# Patient Record
Sex: Female | Born: 1935
Health system: Southern US, Community
[De-identification: ages and names within clinical notes are randomized; demographics above are authoritative.]

## PROBLEM LIST (undated history)

## (undated) DIAGNOSIS — E7800 Pure hypercholesterolemia, unspecified: Secondary | ICD-10-CM

## (undated) DIAGNOSIS — C801 Malignant (primary) neoplasm, unspecified: Secondary | ICD-10-CM

## (undated) DIAGNOSIS — I10 Essential (primary) hypertension: Secondary | ICD-10-CM

## (undated) HISTORY — PX: ABDOMINAL SURGERY: SHX537

## (undated) HISTORY — PX: ABDOMINAL HYSTERECTOMY: SHX81

## (undated) HISTORY — PX: BACK SURGERY: SHX140

---

## 1998-01-05 ENCOUNTER — Other Ambulatory Visit: Admission: RE | Admit: 1998-01-05 | Discharge: 1998-01-05 | Payer: Self-pay | Admitting: Internal Medicine

## 1998-05-17 ENCOUNTER — Encounter: Payer: Self-pay | Admitting: Internal Medicine

## 1998-05-17 ENCOUNTER — Ambulatory Visit (HOSPITAL_COMMUNITY): Admission: RE | Admit: 1998-05-17 | Discharge: 1998-05-17 | Payer: Self-pay | Admitting: Internal Medicine

## 1999-12-22 ENCOUNTER — Emergency Department (HOSPITAL_COMMUNITY): Admission: EM | Admit: 1999-12-22 | Discharge: 1999-12-22 | Payer: Self-pay | Admitting: Emergency Medicine

## 2003-05-26 ENCOUNTER — Emergency Department (HOSPITAL_COMMUNITY): Admission: EM | Admit: 2003-05-26 | Discharge: 2003-05-26 | Payer: Self-pay | Admitting: Emergency Medicine

## 2003-05-26 ENCOUNTER — Encounter: Payer: Self-pay | Admitting: Emergency Medicine

## 2007-04-28 ENCOUNTER — Encounter (INDEPENDENT_AMBULATORY_CARE_PROVIDER_SITE_OTHER): Payer: Self-pay | Admitting: *Deleted

## 2007-04-28 ENCOUNTER — Ambulatory Visit (HOSPITAL_COMMUNITY): Admission: RE | Admit: 2007-04-28 | Discharge: 2007-04-28 | Payer: Self-pay | Admitting: *Deleted

## 2010-12-18 NOTE — Op Note (Signed)
NAMEPRESTON, Susan Bradford                 ACCOUNT NO.:  [id number]   MEDICAL RECORD NO.:  [id number]          PATIENT TYPE:  AMB   LOCATION:  ENDO                         FACILITY:  [facility]   PHYSICIAN:  Georgiana Spinner, M.D.    DATE OF BIRTH:  24-May-1934   DATE OF PROCEDURE:  04/28/2007  DATE OF DISCHARGE:                               OPERATIVE REPORT   PROCEDURE:  Upper endoscopy.   INDICATIONS:  Gastroesophageal reflux disease.   ANESTHESIA:  Fentanyl 75 mcg, Versed 4 mg.   PROCEDURE:  With the patient mildly sedated in the left lateral  decubitus position, the Pentax videoscopic endoscope was inserted in the  mouth, passed under direct vision through the esophagus into the  stomach.  Fundus body appeared normal, antrum in the prepyloric area had  one thickened fold which I photographed and biopsied.  Duodenal bulb  showed changes of diffuse erythema which was photographed and the  erythematous areas were biopsied.  Second portion duodenum appeared  normal.  From this point the endoscope was slowly withdrawn taking  circumferential views of duodenal mucosa until the endoscope been pulled  back into the stomach placed in retroflexion to view the stomach from  below.  The endoscope was straightened and withdrawn taking  circumferential views of remaining gastric and esophageal mucosa,  stopping in the distal esophagus to photograph and biopsy an area that  was seen to rule out Barrett's esophagus.  The patient's vital signs,  pulse oximeter remained stable.  The patient tolerated procedure well  without apparent complications.   FINDINGS:  Changes as noted above.  Biopsies taken of distal esophagus,  prepyloric area and duodenal bulb, await biopsy reports.  The patient  will call me for results and follow-up with me as an outpatient.  Proceed to colonoscopy.           ______________________________  Georgiana Spinner, M.D.     GMO/MEDQ  D:  04/28/2007  T:  04/28/2007  Job:  [id number]

## 2010-12-18 NOTE — Op Note (Signed)
Susan Bradford, Susan Bradford                 ACCOUNT NO.:  [id number]   MEDICAL RECORD NO.:  [id number]          PATIENT TYPE:  AMB   LOCATION:  ENDO                         FACILITY:  [facility]   PHYSICIAN:  Georgiana Spinner, M.D.    DATE OF BIRTH:  [birth year]   DATE OF PROCEDURE:  04/28/2007  DATE OF DISCHARGE:                               OPERATIVE REPORT   PROCEDURE:  Flexible sigmoidoscopy.   INDICATIONS:  Colon cancer screening.   ANESTHESIA:  Fentanyl 25 mcg, Versed 1 mg.   PROCEDURE:  With the patient mildly sedated in the left lateral  decubitus position, the Pentax videoscopic colonoscope was inserted the  rectum, passed under direct vision to the sigmoid colon.  It could be  advanced no further without buckling and discomfort despite changing the  direction of passage.  The endoscope was then withdrawn and the  pediatric colonoscope was inserted in the rectum and passed to the same  point and it, too, could be passed no further.  Therefore I elected to  terminate the procedure at this point and withdrew the colonoscope  taking circumferential views of the colonic mucosa, stopping in the  rectum, which appeared normal on direct and showed hemorrhoids on  retroflexed view.  The endoscope was straightened and withdrawn.  The  patient's vital signs and pulse oximeter remained stable.  The patient  tolerated the procedure well without apparent complications.   FINDINGS:  Internal hemorrhoids, some diverticula seen.   PLAN:  Proceed to barium enema.           ______________________________  Georgiana Spinner, M.D.     GMO/MEDQ  D:  04/28/2007  T:  04/28/2007  Job:  [id number]

## 2014-10-25 DIAGNOSIS — D225 Melanocytic nevi of trunk: Secondary | ICD-10-CM | POA: Diagnosis not present

## 2014-10-25 DIAGNOSIS — L821 Other seborrheic keratosis: Secondary | ICD-10-CM | POA: Diagnosis not present

## 2014-10-25 DIAGNOSIS — Z85828 Personal history of other malignant neoplasm of skin: Secondary | ICD-10-CM | POA: Diagnosis not present

## 2014-10-25 DIAGNOSIS — D485 Neoplasm of uncertain behavior of skin: Secondary | ICD-10-CM | POA: Diagnosis not present

## 2014-10-25 DIAGNOSIS — L738 Other specified follicular disorders: Secondary | ICD-10-CM | POA: Diagnosis not present

## 2014-10-25 DIAGNOSIS — D2262 Melanocytic nevi of left upper limb, including shoulder: Secondary | ICD-10-CM | POA: Diagnosis not present

## 2014-10-25 DIAGNOSIS — D1801 Hemangioma of skin and subcutaneous tissue: Secondary | ICD-10-CM | POA: Diagnosis not present

## 2014-11-03 DIAGNOSIS — I10 Essential (primary) hypertension: Secondary | ICD-10-CM | POA: Diagnosis not present

## 2014-11-03 DIAGNOSIS — E782 Mixed hyperlipidemia: Secondary | ICD-10-CM | POA: Diagnosis not present

## 2014-11-03 DIAGNOSIS — R5383 Other fatigue: Secondary | ICD-10-CM | POA: Diagnosis not present

## 2014-11-03 DIAGNOSIS — Z0000 Encounter for general adult medical examination without abnormal findings: Secondary | ICD-10-CM | POA: Diagnosis not present

## 2014-11-03 DIAGNOSIS — Z1389 Encounter for screening for other disorder: Secondary | ICD-10-CM | POA: Diagnosis not present

## 2014-11-24 DIAGNOSIS — M810 Age-related osteoporosis without current pathological fracture: Secondary | ICD-10-CM | POA: Diagnosis not present

## 2014-11-24 DIAGNOSIS — E782 Mixed hyperlipidemia: Secondary | ICD-10-CM | POA: Diagnosis not present

## 2014-11-24 DIAGNOSIS — Z0000 Encounter for general adult medical examination without abnormal findings: Secondary | ICD-10-CM | POA: Diagnosis not present

## 2014-11-24 DIAGNOSIS — M150 Primary generalized (osteo)arthritis: Secondary | ICD-10-CM | POA: Diagnosis not present

## 2014-12-01 DIAGNOSIS — M5441 Lumbago with sciatica, right side: Secondary | ICD-10-CM | POA: Diagnosis not present

## 2014-12-01 DIAGNOSIS — I10 Essential (primary) hypertension: Secondary | ICD-10-CM | POA: Diagnosis not present

## 2014-12-01 DIAGNOSIS — J449 Chronic obstructive pulmonary disease, unspecified: Secondary | ICD-10-CM | POA: Diagnosis not present

## 2014-12-01 DIAGNOSIS — Z780 Asymptomatic menopausal state: Secondary | ICD-10-CM | POA: Diagnosis not present

## 2014-12-14 DIAGNOSIS — M9904 Segmental and somatic dysfunction of sacral region: Secondary | ICD-10-CM | POA: Diagnosis not present

## 2014-12-14 DIAGNOSIS — M9905 Segmental and somatic dysfunction of pelvic region: Secondary | ICD-10-CM | POA: Diagnosis not present

## 2014-12-14 DIAGNOSIS — M5136 Other intervertebral disc degeneration, lumbar region: Secondary | ICD-10-CM | POA: Diagnosis not present

## 2014-12-14 DIAGNOSIS — M9903 Segmental and somatic dysfunction of lumbar region: Secondary | ICD-10-CM | POA: Diagnosis not present

## 2014-12-15 DIAGNOSIS — M9903 Segmental and somatic dysfunction of lumbar region: Secondary | ICD-10-CM | POA: Diagnosis not present

## 2014-12-15 DIAGNOSIS — M9905 Segmental and somatic dysfunction of pelvic region: Secondary | ICD-10-CM | POA: Diagnosis not present

## 2014-12-15 DIAGNOSIS — M9904 Segmental and somatic dysfunction of sacral region: Secondary | ICD-10-CM | POA: Diagnosis not present

## 2014-12-15 DIAGNOSIS — M5136 Other intervertebral disc degeneration, lumbar region: Secondary | ICD-10-CM | POA: Diagnosis not present

## 2014-12-16 DIAGNOSIS — M5136 Other intervertebral disc degeneration, lumbar region: Secondary | ICD-10-CM | POA: Diagnosis not present

## 2014-12-16 DIAGNOSIS — M9905 Segmental and somatic dysfunction of pelvic region: Secondary | ICD-10-CM | POA: Diagnosis not present

## 2014-12-16 DIAGNOSIS — M9904 Segmental and somatic dysfunction of sacral region: Secondary | ICD-10-CM | POA: Diagnosis not present

## 2014-12-16 DIAGNOSIS — M9903 Segmental and somatic dysfunction of lumbar region: Secondary | ICD-10-CM | POA: Diagnosis not present

## 2014-12-20 DIAGNOSIS — M9903 Segmental and somatic dysfunction of lumbar region: Secondary | ICD-10-CM | POA: Diagnosis not present

## 2014-12-20 DIAGNOSIS — M9905 Segmental and somatic dysfunction of pelvic region: Secondary | ICD-10-CM | POA: Diagnosis not present

## 2014-12-20 DIAGNOSIS — M9904 Segmental and somatic dysfunction of sacral region: Secondary | ICD-10-CM | POA: Diagnosis not present

## 2014-12-20 DIAGNOSIS — M5136 Other intervertebral disc degeneration, lumbar region: Secondary | ICD-10-CM | POA: Diagnosis not present

## 2014-12-21 DIAGNOSIS — M9905 Segmental and somatic dysfunction of pelvic region: Secondary | ICD-10-CM | POA: Diagnosis not present

## 2014-12-21 DIAGNOSIS — M5136 Other intervertebral disc degeneration, lumbar region: Secondary | ICD-10-CM | POA: Diagnosis not present

## 2014-12-21 DIAGNOSIS — M9904 Segmental and somatic dysfunction of sacral region: Secondary | ICD-10-CM | POA: Diagnosis not present

## 2014-12-21 DIAGNOSIS — M9903 Segmental and somatic dysfunction of lumbar region: Secondary | ICD-10-CM | POA: Diagnosis not present

## 2014-12-23 DIAGNOSIS — M9905 Segmental and somatic dysfunction of pelvic region: Secondary | ICD-10-CM | POA: Diagnosis not present

## 2014-12-23 DIAGNOSIS — M9903 Segmental and somatic dysfunction of lumbar region: Secondary | ICD-10-CM | POA: Diagnosis not present

## 2014-12-23 DIAGNOSIS — M9904 Segmental and somatic dysfunction of sacral region: Secondary | ICD-10-CM | POA: Diagnosis not present

## 2014-12-23 DIAGNOSIS — M5136 Other intervertebral disc degeneration, lumbar region: Secondary | ICD-10-CM | POA: Diagnosis not present

## 2015-01-23 DIAGNOSIS — M9904 Segmental and somatic dysfunction of sacral region: Secondary | ICD-10-CM | POA: Diagnosis not present

## 2015-01-23 DIAGNOSIS — M5136 Other intervertebral disc degeneration, lumbar region: Secondary | ICD-10-CM | POA: Diagnosis not present

## 2015-01-23 DIAGNOSIS — M9903 Segmental and somatic dysfunction of lumbar region: Secondary | ICD-10-CM | POA: Diagnosis not present

## 2015-01-23 DIAGNOSIS — M9905 Segmental and somatic dysfunction of pelvic region: Secondary | ICD-10-CM | POA: Diagnosis not present

## 2015-02-21 DIAGNOSIS — M9905 Segmental and somatic dysfunction of pelvic region: Secondary | ICD-10-CM | POA: Diagnosis not present

## 2015-02-21 DIAGNOSIS — M9904 Segmental and somatic dysfunction of sacral region: Secondary | ICD-10-CM | POA: Diagnosis not present

## 2015-02-21 DIAGNOSIS — M5136 Other intervertebral disc degeneration, lumbar region: Secondary | ICD-10-CM | POA: Diagnosis not present

## 2015-02-21 DIAGNOSIS — M9903 Segmental and somatic dysfunction of lumbar region: Secondary | ICD-10-CM | POA: Diagnosis not present

## 2015-04-06 DIAGNOSIS — M9904 Segmental and somatic dysfunction of sacral region: Secondary | ICD-10-CM | POA: Diagnosis not present

## 2015-04-06 DIAGNOSIS — M9905 Segmental and somatic dysfunction of pelvic region: Secondary | ICD-10-CM | POA: Diagnosis not present

## 2015-04-06 DIAGNOSIS — M9903 Segmental and somatic dysfunction of lumbar region: Secondary | ICD-10-CM | POA: Diagnosis not present

## 2015-04-06 DIAGNOSIS — M5136 Other intervertebral disc degeneration, lumbar region: Secondary | ICD-10-CM | POA: Diagnosis not present

## 2015-04-20 DIAGNOSIS — J449 Chronic obstructive pulmonary disease, unspecified: Secondary | ICD-10-CM | POA: Diagnosis not present

## 2015-04-20 DIAGNOSIS — I10 Essential (primary) hypertension: Secondary | ICD-10-CM | POA: Diagnosis not present

## 2015-04-27 DIAGNOSIS — I10 Essential (primary) hypertension: Secondary | ICD-10-CM | POA: Diagnosis not present

## 2015-04-27 DIAGNOSIS — E782 Mixed hyperlipidemia: Secondary | ICD-10-CM | POA: Diagnosis not present

## 2015-04-27 DIAGNOSIS — M810 Age-related osteoporosis without current pathological fracture: Secondary | ICD-10-CM | POA: Diagnosis not present

## 2015-05-04 DIAGNOSIS — M9901 Segmental and somatic dysfunction of cervical region: Secondary | ICD-10-CM | POA: Diagnosis not present

## 2015-05-04 DIAGNOSIS — M9902 Segmental and somatic dysfunction of thoracic region: Secondary | ICD-10-CM | POA: Diagnosis not present

## 2015-05-04 DIAGNOSIS — M5032 Other cervical disc degeneration, mid-cervical region: Secondary | ICD-10-CM | POA: Diagnosis not present

## 2015-05-04 DIAGNOSIS — M25511 Pain in right shoulder: Secondary | ICD-10-CM | POA: Diagnosis not present

## 2015-05-11 DIAGNOSIS — M9901 Segmental and somatic dysfunction of cervical region: Secondary | ICD-10-CM | POA: Diagnosis not present

## 2015-05-11 DIAGNOSIS — M5032 Other cervical disc degeneration, mid-cervical region, unspecified level: Secondary | ICD-10-CM | POA: Diagnosis not present

## 2015-05-11 DIAGNOSIS — M25511 Pain in right shoulder: Secondary | ICD-10-CM | POA: Diagnosis not present

## 2015-05-11 DIAGNOSIS — M9902 Segmental and somatic dysfunction of thoracic region: Secondary | ICD-10-CM | POA: Diagnosis not present

## 2015-09-14 DIAGNOSIS — M810 Age-related osteoporosis without current pathological fracture: Secondary | ICD-10-CM | POA: Diagnosis not present

## 2015-09-14 DIAGNOSIS — E782 Mixed hyperlipidemia: Secondary | ICD-10-CM | POA: Diagnosis not present

## 2015-09-14 DIAGNOSIS — I10 Essential (primary) hypertension: Secondary | ICD-10-CM | POA: Diagnosis not present

## 2015-09-21 DIAGNOSIS — E782 Mixed hyperlipidemia: Secondary | ICD-10-CM | POA: Diagnosis not present

## 2015-09-21 DIAGNOSIS — S46001A Unspecified injury of muscle(s) and tendon(s) of the rotator cuff of right shoulder, initial encounter: Secondary | ICD-10-CM | POA: Diagnosis not present

## 2015-09-21 DIAGNOSIS — I10 Essential (primary) hypertension: Secondary | ICD-10-CM | POA: Diagnosis not present

## 2015-09-21 DIAGNOSIS — M150 Primary generalized (osteo)arthritis: Secondary | ICD-10-CM | POA: Diagnosis not present

## 2015-10-05 DIAGNOSIS — Z23 Encounter for immunization: Secondary | ICD-10-CM | POA: Diagnosis not present

## 2015-12-19 DIAGNOSIS — R05 Cough: Secondary | ICD-10-CM | POA: Diagnosis not present

## 2015-12-19 DIAGNOSIS — J189 Pneumonia, unspecified organism: Secondary | ICD-10-CM | POA: Diagnosis not present

## 2015-12-26 DIAGNOSIS — M150 Primary generalized (osteo)arthritis: Secondary | ICD-10-CM | POA: Diagnosis not present

## 2015-12-26 DIAGNOSIS — J449 Chronic obstructive pulmonary disease, unspecified: Secondary | ICD-10-CM | POA: Diagnosis not present

## 2015-12-26 DIAGNOSIS — F329 Major depressive disorder, single episode, unspecified: Secondary | ICD-10-CM | POA: Diagnosis not present

## 2015-12-29 ENCOUNTER — Other Ambulatory Visit (HOSPITAL_COMMUNITY): Payer: Self-pay | Admitting: Respiratory Therapy

## 2015-12-29 DIAGNOSIS — J441 Chronic obstructive pulmonary disease with (acute) exacerbation: Secondary | ICD-10-CM

## 2015-12-29 DIAGNOSIS — R0602 Shortness of breath: Secondary | ICD-10-CM

## 2016-01-02 DIAGNOSIS — J449 Chronic obstructive pulmonary disease, unspecified: Secondary | ICD-10-CM | POA: Diagnosis not present

## 2016-01-02 DIAGNOSIS — F329 Major depressive disorder, single episode, unspecified: Secondary | ICD-10-CM | POA: Diagnosis not present

## 2016-01-11 DIAGNOSIS — I10 Essential (primary) hypertension: Secondary | ICD-10-CM | POA: Diagnosis not present

## 2016-01-11 DIAGNOSIS — E782 Mixed hyperlipidemia: Secondary | ICD-10-CM | POA: Diagnosis not present

## 2016-01-11 DIAGNOSIS — M150 Primary generalized (osteo)arthritis: Secondary | ICD-10-CM | POA: Diagnosis not present

## 2016-01-11 DIAGNOSIS — Z0000 Encounter for general adult medical examination without abnormal findings: Secondary | ICD-10-CM | POA: Diagnosis not present

## 2016-01-17 ENCOUNTER — Ambulatory Visit (HOSPITAL_COMMUNITY)
Admission: RE | Admit: 2016-01-17 | Discharge: 2016-01-17 | Disposition: A | Discharge status: Home / Self Care | Payer: Medicare Other | Source: Ambulatory Visit | Attending: Internal Medicine | Admitting: Internal Medicine

## 2016-01-17 DIAGNOSIS — R0602 Shortness of breath: Secondary | ICD-10-CM | POA: Insufficient documentation

## 2016-01-17 DIAGNOSIS — J441 Chronic obstructive pulmonary disease with (acute) exacerbation: Secondary | ICD-10-CM

## 2016-01-17 DIAGNOSIS — J449 Chronic obstructive pulmonary disease, unspecified: Secondary | ICD-10-CM | POA: Insufficient documentation

## 2016-01-17 LAB — SPIROMETRY WITH GRAPH
FEF 25-75 Pre: 0.75 L/sec
FEF2575-%Pred-Pre: 53 %
FEV1-%Pred-Pre: 65 %
FEV1-Pre: 1.23 L
FEV1FVC-%Pred-Pre: 90 %
FEV6-%Pred-Pre: 76 %
FEV6-Pre: 1.83 L
FEV6FVC-%Pred-Pre: 106 %
FVC-%Pred-Pre: 72 %
FVC-Pre: 1.84 L
Pre FEV1/FVC ratio: 67 %
Pre FEV6/FVC Ratio: 100 %

## 2016-01-18 DIAGNOSIS — I10 Essential (primary) hypertension: Secondary | ICD-10-CM | POA: Diagnosis not present

## 2016-01-18 DIAGNOSIS — M5440 Lumbago with sciatica, unspecified side: Secondary | ICD-10-CM | POA: Diagnosis not present

## 2016-01-18 DIAGNOSIS — E782 Mixed hyperlipidemia: Secondary | ICD-10-CM | POA: Diagnosis not present

## 2016-01-18 DIAGNOSIS — J449 Chronic obstructive pulmonary disease, unspecified: Secondary | ICD-10-CM | POA: Diagnosis not present

## 2016-02-29 ENCOUNTER — Encounter: Payer: Self-pay | Admitting: Pulmonary Disease

## 2016-02-29 ENCOUNTER — Ambulatory Visit (INDEPENDENT_AMBULATORY_CARE_PROVIDER_SITE_OTHER): Payer: Medicare Other | Admitting: Pulmonary Disease

## 2016-02-29 DIAGNOSIS — J449 Chronic obstructive pulmonary disease, unspecified: Secondary | ICD-10-CM | POA: Diagnosis not present

## 2016-02-29 NOTE — Progress Notes (Signed)
   Subjective:    Patient ID: Susan Bradford, female    DOB: 08-08-1933, 82 y.o.   MRN: [id number]  HPI  Chief Complaint  Patient presents with  . Pulmonary Consult    Referred by Dr. Ashby Dawes; not sure if she has asthma or COPD, as been told that she has both. clearing throat a lot from allergies.  SOB.  Spirometry done at [facility] on 01/17/16.    82 year old remote smoker presents for evaluation of COPD. She smoked about 50 pack years starting as a teenager until she quit in 2002. She reports worsening dyspnea on exertion for 2 years especially in the hot weather and when she moves around too fast. She reports an episode of pneumonia in 10/2015 after she took pneumonia  Shot-and it took her 3 weeks to improve. She was started on Symbicort which  Has helped a whole lot with her breathing. She reports occasional wheezing, denies sinus drip or heartburn.  Spirometry /2017 shows ratio of 67, FEV1 65% and FVC 72% suggestive of moderate airway obstruction.   There is no history of chest pain,  Orthopnea, paroxysmal nocturnal dyspnea or nocturnal wheezing. She moved to [facility] from [place] about 50 years ago. She denies seasonal allergies She was told variously that she has COPD vs asthma and she would like to know what she really has.      No past medical history on file.  - hypertension, hyperlipidemia, insomnia, degenerative arthritis  No past surgical history on file. -breast cyst removal No Known Allergies   Social History   Social History  . Marital status: Widowed    Spouse name: N/A  . Number of children: N/A  . Years of education: N/A   Occupational History  . Not on file.   Social History Main Topics  . Smoking status: Former Smoker    Packs/day: 1.00    Years: 50.00    Types: Cigarettes    Quit date: 02/28/2001  . Smokeless tobacco: Never Used  . Alcohol use Yes     Comment: occasionally  . Drug use: No  . Sexual activity: Not Currently   Other  Topics Concern  . Not on file   Social History Narrative  . No narrative on file     No family history on file. -parents died of old age and heart disease  Review of Systems  Constitutional: Negative for chills, fever and unexpected weight change.  HENT: Negative for congestion, dental problem, ear pain, nosebleeds, postnasal drip, rhinorrhea, sinus pressure, sneezing, sore throat, trouble swallowing and voice change.   Eyes: Negative for visual disturbance.  Respiratory: Positive for shortness of breath. Negative for cough and choking.   Cardiovascular: Negative for chest pain and leg swelling.  Gastrointestinal: Negative for abdominal pain, diarrhea and vomiting.  Genitourinary: Negative for difficulty urinating.  Musculoskeletal: Negative for arthralgias.  Skin: Negative for rash.  Neurological: Negative for tremors, syncope and headaches.  Hematological: Does not bruise/bleed easily.       Objective:   Physical Exam  Gen. Pleasant, well-nourished, in no distress ENT - no lesions, no post nasal drip Neck: No JVD, no thyromegaly, no carotid bruits Lungs: no use of accessory muscles, no dullness to percussion, clear without rales or rhonchi  Cardiovascular: Rhythm regular, heart sounds  normal, no murmurs or gallops, no peripheral edema Musculoskeletal: No deformities, no cyanosis or clubbing        Assessment & Plan:

## 2016-02-29 NOTE — Assessment & Plan Note (Addendum)
stage II COPD Stay on Symbicort-can take up to 2 puffs twice daily-rinse mouth after use We will escalate to LABA/ LAMA combination if she is worse in the future  Repeat spirometry pre-and post on next visit -feel that this is more likely COPD given her long history of smoking and recent onset of symptoms, but would like to checkk for bronchodilator response

## 2016-02-29 NOTE — Patient Instructions (Signed)
You have stage II COPD Stay on Symbicort-can take up to 2 puffs twice daily-rinse mouth after use  Repeat breathing test on next visit

## 2016-03-05 ENCOUNTER — Telehealth: Payer: Self-pay | Admitting: Pulmonary Disease

## 2016-03-05 DIAGNOSIS — J449 Chronic obstructive pulmonary disease, unspecified: Secondary | ICD-10-CM

## 2016-03-05 NOTE — Telephone Encounter (Signed)
Called Dr. Mathis Fare office and they stated that they do not have a CXR on file for this patient.  Per Dr. Elsworth Soho, ordered CXR.  Patient advised to come by and have CXR done at her earliest convenience.  Nothing further needed.

## 2016-03-13 DIAGNOSIS — M25511 Pain in right shoulder: Secondary | ICD-10-CM | POA: Diagnosis not present

## 2016-03-14 ENCOUNTER — Encounter: Payer: Self-pay | Admitting: Pulmonary Disease

## 2016-04-03 DIAGNOSIS — S46001A Unspecified injury of muscle(s) and tendon(s) of the rotator cuff of right shoulder, initial encounter: Secondary | ICD-10-CM | POA: Diagnosis not present

## 2016-09-03 ENCOUNTER — Ambulatory Visit: Payer: Medicare Other | Admitting: Pulmonary Disease

## 2016-09-19 DIAGNOSIS — I10 Essential (primary) hypertension: Secondary | ICD-10-CM | POA: Diagnosis not present

## 2016-09-19 DIAGNOSIS — E782 Mixed hyperlipidemia: Secondary | ICD-10-CM | POA: Diagnosis not present

## 2016-09-26 DIAGNOSIS — E782 Mixed hyperlipidemia: Secondary | ICD-10-CM | POA: Diagnosis not present

## 2016-09-26 DIAGNOSIS — F329 Major depressive disorder, single episode, unspecified: Secondary | ICD-10-CM | POA: Diagnosis not present

## 2016-09-26 DIAGNOSIS — M19019 Primary osteoarthritis, unspecified shoulder: Secondary | ICD-10-CM | POA: Diagnosis not present

## 2016-09-26 DIAGNOSIS — M150 Primary generalized (osteo)arthritis: Secondary | ICD-10-CM | POA: Diagnosis not present

## 2016-09-28 DIAGNOSIS — S2242XA Multiple fractures of ribs, left side, initial encounter for closed fracture: Secondary | ICD-10-CM | POA: Diagnosis not present

## 2016-09-28 DIAGNOSIS — F329 Major depressive disorder, single episode, unspecified: Secondary | ICD-10-CM | POA: Diagnosis not present

## 2016-09-28 DIAGNOSIS — Z041 Encounter for examination and observation following transport accident: Secondary | ICD-10-CM | POA: Diagnosis not present

## 2016-09-28 DIAGNOSIS — J449 Chronic obstructive pulmonary disease, unspecified: Secondary | ICD-10-CM | POA: Diagnosis not present

## 2016-09-28 DIAGNOSIS — I10 Essential (primary) hypertension: Secondary | ICD-10-CM | POA: Diagnosis not present

## 2016-09-28 DIAGNOSIS — Z87891 Personal history of nicotine dependence: Secondary | ICD-10-CM | POA: Diagnosis not present

## 2016-09-28 DIAGNOSIS — R079 Chest pain, unspecified: Secondary | ICD-10-CM | POA: Diagnosis not present

## 2016-09-28 DIAGNOSIS — E785 Hyperlipidemia, unspecified: Secondary | ICD-10-CM | POA: Diagnosis not present

## 2016-09-29 DIAGNOSIS — F329 Major depressive disorder, single episode, unspecified: Secondary | ICD-10-CM | POA: Diagnosis present

## 2016-09-29 DIAGNOSIS — R0789 Other chest pain: Secondary | ICD-10-CM | POA: Diagnosis not present

## 2016-09-29 DIAGNOSIS — R1904 Left lower quadrant abdominal swelling, mass and lump: Secondary | ICD-10-CM | POA: Diagnosis not present

## 2016-09-29 DIAGNOSIS — E785 Hyperlipidemia, unspecified: Secondary | ICD-10-CM | POA: Diagnosis present

## 2016-09-29 DIAGNOSIS — G8918 Other acute postprocedural pain: Secondary | ICD-10-CM | POA: Diagnosis not present

## 2016-09-29 DIAGNOSIS — M546 Pain in thoracic spine: Secondary | ICD-10-CM | POA: Diagnosis not present

## 2016-09-29 DIAGNOSIS — S2242XA Multiple fractures of ribs, left side, initial encounter for closed fracture: Secondary | ICD-10-CM | POA: Diagnosis present

## 2016-09-29 DIAGNOSIS — Z87891 Personal history of nicotine dependence: Secondary | ICD-10-CM | POA: Diagnosis not present

## 2016-09-29 DIAGNOSIS — R935 Abnormal findings on diagnostic imaging of other abdominal regions, including retroperitoneum: Secondary | ICD-10-CM | POA: Diagnosis not present

## 2016-09-29 DIAGNOSIS — G8911 Acute pain due to trauma: Secondary | ICD-10-CM | POA: Diagnosis not present

## 2016-09-29 DIAGNOSIS — I10 Essential (primary) hypertension: Secondary | ICD-10-CM | POA: Diagnosis present

## 2016-09-29 DIAGNOSIS — J449 Chronic obstructive pulmonary disease, unspecified: Secondary | ICD-10-CM | POA: Diagnosis present

## 2016-09-30 DIAGNOSIS — R1904 Left lower quadrant abdominal swelling, mass and lump: Secondary | ICD-10-CM | POA: Diagnosis not present

## 2016-09-30 DIAGNOSIS — R935 Abnormal findings on diagnostic imaging of other abdominal regions, including retroperitoneum: Secondary | ICD-10-CM | POA: Diagnosis not present

## 2016-10-05 DIAGNOSIS — J449 Chronic obstructive pulmonary disease, unspecified: Secondary | ICD-10-CM | POA: Diagnosis not present

## 2016-10-05 DIAGNOSIS — F329 Major depressive disorder, single episode, unspecified: Secondary | ICD-10-CM | POA: Diagnosis not present

## 2016-10-05 DIAGNOSIS — I10 Essential (primary) hypertension: Secondary | ICD-10-CM | POA: Diagnosis not present

## 2016-10-05 DIAGNOSIS — Z9181 History of falling: Secondary | ICD-10-CM | POA: Diagnosis not present

## 2016-10-05 DIAGNOSIS — Z87891 Personal history of nicotine dependence: Secondary | ICD-10-CM | POA: Diagnosis not present

## 2016-10-05 DIAGNOSIS — S2242XD Multiple fractures of ribs, left side, subsequent encounter for fracture with routine healing: Secondary | ICD-10-CM | POA: Diagnosis not present

## 2016-10-05 DIAGNOSIS — Z9981 Dependence on supplemental oxygen: Secondary | ICD-10-CM | POA: Diagnosis not present

## 2016-10-07 ENCOUNTER — Ambulatory Visit: Payer: Medicare Other | Admitting: Pulmonary Disease

## 2016-10-07 DIAGNOSIS — S2242XD Multiple fractures of ribs, left side, subsequent encounter for fracture with routine healing: Secondary | ICD-10-CM | POA: Diagnosis not present

## 2016-10-07 DIAGNOSIS — J449 Chronic obstructive pulmonary disease, unspecified: Secondary | ICD-10-CM | POA: Diagnosis not present

## 2016-10-07 DIAGNOSIS — Z87891 Personal history of nicotine dependence: Secondary | ICD-10-CM | POA: Diagnosis not present

## 2016-10-07 DIAGNOSIS — Z9981 Dependence on supplemental oxygen: Secondary | ICD-10-CM | POA: Diagnosis not present

## 2016-10-07 DIAGNOSIS — I10 Essential (primary) hypertension: Secondary | ICD-10-CM | POA: Diagnosis not present

## 2016-10-07 DIAGNOSIS — F329 Major depressive disorder, single episode, unspecified: Secondary | ICD-10-CM | POA: Diagnosis not present

## 2016-10-09 DIAGNOSIS — Z9981 Dependence on supplemental oxygen: Secondary | ICD-10-CM | POA: Diagnosis not present

## 2016-10-09 DIAGNOSIS — S2242XD Multiple fractures of ribs, left side, subsequent encounter for fracture with routine healing: Secondary | ICD-10-CM | POA: Diagnosis not present

## 2016-10-09 DIAGNOSIS — I10 Essential (primary) hypertension: Secondary | ICD-10-CM | POA: Diagnosis not present

## 2016-10-09 DIAGNOSIS — J449 Chronic obstructive pulmonary disease, unspecified: Secondary | ICD-10-CM | POA: Diagnosis not present

## 2016-10-09 DIAGNOSIS — F329 Major depressive disorder, single episode, unspecified: Secondary | ICD-10-CM | POA: Diagnosis not present

## 2016-10-09 DIAGNOSIS — Z87891 Personal history of nicotine dependence: Secondary | ICD-10-CM | POA: Diagnosis not present

## 2016-10-15 DIAGNOSIS — J449 Chronic obstructive pulmonary disease, unspecified: Secondary | ICD-10-CM | POA: Diagnosis not present

## 2016-10-15 DIAGNOSIS — S2242XD Multiple fractures of ribs, left side, subsequent encounter for fracture with routine healing: Secondary | ICD-10-CM | POA: Diagnosis not present

## 2016-10-15 DIAGNOSIS — Z9981 Dependence on supplemental oxygen: Secondary | ICD-10-CM | POA: Diagnosis not present

## 2016-10-15 DIAGNOSIS — F329 Major depressive disorder, single episode, unspecified: Secondary | ICD-10-CM | POA: Diagnosis not present

## 2016-10-15 DIAGNOSIS — Z87891 Personal history of nicotine dependence: Secondary | ICD-10-CM | POA: Diagnosis not present

## 2016-10-15 DIAGNOSIS — I10 Essential (primary) hypertension: Secondary | ICD-10-CM | POA: Diagnosis not present

## 2016-10-16 DIAGNOSIS — R1900 Intra-abdominal and pelvic swelling, mass and lump, unspecified site: Secondary | ICD-10-CM | POA: Diagnosis not present

## 2016-10-16 DIAGNOSIS — N6322 Unspecified lump in the left breast, upper inner quadrant: Secondary | ICD-10-CM | POA: Diagnosis not present

## 2016-10-16 DIAGNOSIS — N6323 Unspecified lump in the left breast, lower outer quadrant: Secondary | ICD-10-CM | POA: Diagnosis not present

## 2016-10-17 DIAGNOSIS — S2242XD Multiple fractures of ribs, left side, subsequent encounter for fracture with routine healing: Secondary | ICD-10-CM | POA: Diagnosis not present

## 2016-10-17 DIAGNOSIS — J449 Chronic obstructive pulmonary disease, unspecified: Secondary | ICD-10-CM | POA: Diagnosis not present

## 2016-10-17 DIAGNOSIS — Z87891 Personal history of nicotine dependence: Secondary | ICD-10-CM | POA: Diagnosis not present

## 2016-10-17 DIAGNOSIS — Z9981 Dependence on supplemental oxygen: Secondary | ICD-10-CM | POA: Diagnosis not present

## 2016-10-17 DIAGNOSIS — F329 Major depressive disorder, single episode, unspecified: Secondary | ICD-10-CM | POA: Diagnosis not present

## 2016-10-17 DIAGNOSIS — I10 Essential (primary) hypertension: Secondary | ICD-10-CM | POA: Diagnosis not present

## 2016-10-21 DIAGNOSIS — N6321 Unspecified lump in the left breast, upper outer quadrant: Secondary | ICD-10-CM | POA: Diagnosis not present

## 2016-10-21 DIAGNOSIS — N6322 Unspecified lump in the left breast, upper inner quadrant: Secondary | ICD-10-CM | POA: Diagnosis not present

## 2016-10-21 DIAGNOSIS — R928 Other abnormal and inconclusive findings on diagnostic imaging of breast: Secondary | ICD-10-CM | POA: Diagnosis not present

## 2016-10-22 DIAGNOSIS — I10 Essential (primary) hypertension: Secondary | ICD-10-CM | POA: Diagnosis not present

## 2016-10-23 DIAGNOSIS — J449 Chronic obstructive pulmonary disease, unspecified: Secondary | ICD-10-CM | POA: Diagnosis not present

## 2016-10-23 DIAGNOSIS — F329 Major depressive disorder, single episode, unspecified: Secondary | ICD-10-CM | POA: Diagnosis not present

## 2016-10-23 DIAGNOSIS — I10 Essential (primary) hypertension: Secondary | ICD-10-CM | POA: Diagnosis not present

## 2016-10-24 ENCOUNTER — Encounter: Payer: Self-pay | Admitting: Pulmonary Disease

## 2016-10-24 ENCOUNTER — Ambulatory Visit (INDEPENDENT_AMBULATORY_CARE_PROVIDER_SITE_OTHER): Payer: Medicare Other | Admitting: Pulmonary Disease

## 2016-10-24 DIAGNOSIS — J432 Centrilobular emphysema: Secondary | ICD-10-CM

## 2016-10-24 MED ORDER — BUDESONIDE-FORMOTEROL FUMARATE 160-4.5 MCG/ACT IN AERO: 2.0000 | INHALATION_SPRAY | Freq: Two times a day (BID) | RESPIRATORY_TRACT | 0 refills | Status: AC

## 2016-10-24 NOTE — Addendum Note (Signed)
Addended by: Valerie Salts on: 10/24/2016 01:31 PM   Modules accepted: Orders

## 2016-10-24 NOTE — Patient Instructions (Signed)
Clearance for surgery will be sent to Dr. Valentino Hue Schedule spirometry pre-and post on next visit

## 2016-10-24 NOTE — Assessment & Plan Note (Addendum)
Cleared for surgery with due risk Schedule spirometry pre-and post on next visit  Stay on Symbicort 2 puffs twice daily  Early mobilization, DVT prophylaxis and incentive spirometry as would be routine after such surgery
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
## 2016-10-25 DIAGNOSIS — E782 Mixed hyperlipidemia: Secondary | ICD-10-CM | POA: Diagnosis not present

## 2016-10-25 DIAGNOSIS — M150 Primary generalized (osteo)arthritis: Secondary | ICD-10-CM | POA: Diagnosis not present

## 2016-10-25 DIAGNOSIS — Z01818 Encounter for other preprocedural examination: Secondary | ICD-10-CM | POA: Diagnosis not present

## 2016-10-25 DIAGNOSIS — I10 Essential (primary) hypertension: Secondary | ICD-10-CM | POA: Diagnosis not present

## 2016-10-28 DIAGNOSIS — I9581 Postprocedural hypotension: Secondary | ICD-10-CM | POA: Diagnosis not present

## 2016-10-28 DIAGNOSIS — Z87828 Personal history of other (healed) physical injury and trauma: Secondary | ICD-10-CM | POA: Diagnosis not present

## 2016-10-28 DIAGNOSIS — G4700 Insomnia, unspecified: Secondary | ICD-10-CM | POA: Diagnosis not present

## 2016-10-28 DIAGNOSIS — Z79899 Other long term (current) drug therapy: Secondary | ICD-10-CM | POA: Diagnosis not present

## 2016-10-28 DIAGNOSIS — F329 Major depressive disorder, single episode, unspecified: Secondary | ICD-10-CM | POA: Diagnosis not present

## 2016-10-28 DIAGNOSIS — Z91048 Other nonmedicinal substance allergy status: Secondary | ICD-10-CM | POA: Diagnosis not present

## 2016-10-28 DIAGNOSIS — Z7982 Long term (current) use of aspirin: Secondary | ICD-10-CM | POA: Diagnosis not present

## 2016-10-28 DIAGNOSIS — D3912 Neoplasm of uncertain behavior of left ovary: Secondary | ICD-10-CM | POA: Diagnosis not present

## 2016-10-28 DIAGNOSIS — K9189 Other postprocedural complications and disorders of digestive system: Secondary | ICD-10-CM | POA: Diagnosis not present

## 2016-10-28 DIAGNOSIS — D696 Thrombocytopenia, unspecified: Secondary | ICD-10-CM | POA: Diagnosis not present

## 2016-10-28 DIAGNOSIS — D72819 Decreased white blood cell count, unspecified: Secondary | ICD-10-CM | POA: Diagnosis not present

## 2016-10-28 DIAGNOSIS — D62 Acute posthemorrhagic anemia: Secondary | ICD-10-CM | POA: Diagnosis not present

## 2016-10-28 DIAGNOSIS — I10 Essential (primary) hypertension: Secondary | ICD-10-CM | POA: Diagnosis not present

## 2016-10-28 DIAGNOSIS — R40241 Glasgow coma scale score 13-15, unspecified time: Secondary | ICD-10-CM | POA: Diagnosis not present

## 2016-10-28 DIAGNOSIS — E785 Hyperlipidemia, unspecified: Secondary | ICD-10-CM | POA: Diagnosis not present

## 2016-10-28 DIAGNOSIS — K567 Ileus, unspecified: Secondary | ICD-10-CM | POA: Diagnosis not present

## 2016-10-28 DIAGNOSIS — J984 Other disorders of lung: Secondary | ICD-10-CM | POA: Diagnosis not present

## 2016-10-28 DIAGNOSIS — R197 Diarrhea, unspecified: Secondary | ICD-10-CM | POA: Diagnosis not present

## 2016-10-28 DIAGNOSIS — Z87891 Personal history of nicotine dependence: Secondary | ICD-10-CM | POA: Diagnosis not present

## 2016-10-28 DIAGNOSIS — E861 Hypovolemia: Secondary | ICD-10-CM | POA: Diagnosis not present

## 2016-10-28 DIAGNOSIS — Z85828 Personal history of other malignant neoplasm of skin: Secondary | ICD-10-CM | POA: Diagnosis not present

## 2016-10-28 DIAGNOSIS — Z7951 Long term (current) use of inhaled steroids: Secondary | ICD-10-CM | POA: Diagnosis not present

## 2016-10-28 DIAGNOSIS — E876 Hypokalemia: Secondary | ICD-10-CM | POA: Diagnosis not present

## 2016-10-28 DIAGNOSIS — J449 Chronic obstructive pulmonary disease, unspecified: Secondary | ICD-10-CM | POA: Diagnosis not present

## 2016-11-01 DIAGNOSIS — N6489 Other specified disorders of breast: Secondary | ICD-10-CM | POA: Diagnosis not present

## 2016-11-01 DIAGNOSIS — R928 Other abnormal and inconclusive findings on diagnostic imaging of breast: Secondary | ICD-10-CM | POA: Diagnosis not present

## 2016-11-01 DIAGNOSIS — N6012 Diffuse cystic mastopathy of left breast: Secondary | ICD-10-CM | POA: Diagnosis not present

## 2016-11-01 DIAGNOSIS — N6022 Fibroadenosis of left breast: Secondary | ICD-10-CM | POA: Diagnosis not present

## 2016-11-01 DIAGNOSIS — N6092 Unspecified benign mammary dysplasia of left breast: Secondary | ICD-10-CM | POA: Diagnosis not present

## 2016-11-02 DIAGNOSIS — N62 Hypertrophy of breast: Secondary | ICD-10-CM | POA: Diagnosis not present

## 2016-11-05 DIAGNOSIS — G4700 Insomnia, unspecified: Secondary | ICD-10-CM | POA: Diagnosis present

## 2016-11-05 DIAGNOSIS — Z87828 Personal history of other (healed) physical injury and trauma: Secondary | ICD-10-CM | POA: Diagnosis not present

## 2016-11-05 DIAGNOSIS — R197 Diarrhea, unspecified: Secondary | ICD-10-CM | POA: Diagnosis not present

## 2016-11-05 DIAGNOSIS — E861 Hypovolemia: Secondary | ICD-10-CM | POA: Diagnosis not present

## 2016-11-05 DIAGNOSIS — Z7951 Long term (current) use of inhaled steroids: Secondary | ICD-10-CM | POA: Diagnosis not present

## 2016-11-05 DIAGNOSIS — R40241 Glasgow coma scale score 13-15, unspecified time: Secondary | ICD-10-CM | POA: Diagnosis present

## 2016-11-05 DIAGNOSIS — R2689 Other abnormalities of gait and mobility: Secondary | ICD-10-CM | POA: Diagnosis not present

## 2016-11-05 DIAGNOSIS — Z79899 Other long term (current) drug therapy: Secondary | ICD-10-CM | POA: Diagnosis not present

## 2016-11-05 DIAGNOSIS — Z8679 Personal history of other diseases of the circulatory system: Secondary | ICD-10-CM | POA: Diagnosis not present

## 2016-11-05 DIAGNOSIS — G8918 Other acute postprocedural pain: Secondary | ICD-10-CM | POA: Diagnosis not present

## 2016-11-05 DIAGNOSIS — R110 Nausea: Secondary | ICD-10-CM | POA: Diagnosis not present

## 2016-11-05 DIAGNOSIS — D72819 Decreased white blood cell count, unspecified: Secondary | ICD-10-CM | POA: Diagnosis not present

## 2016-11-05 DIAGNOSIS — S2222XA Fracture of body of sternum, initial encounter for closed fracture: Secondary | ICD-10-CM | POA: Diagnosis not present

## 2016-11-05 DIAGNOSIS — K9189 Other postprocedural complications and disorders of digestive system: Secondary | ICD-10-CM | POA: Diagnosis not present

## 2016-11-05 DIAGNOSIS — N838 Other noninflammatory disorders of ovary, fallopian tube and broad ligament: Secondary | ICD-10-CM | POA: Diagnosis not present

## 2016-11-05 DIAGNOSIS — N7011 Chronic salpingitis: Secondary | ICD-10-CM | POA: Diagnosis not present

## 2016-11-05 DIAGNOSIS — Z91048 Other nonmedicinal substance allergy status: Secondary | ICD-10-CM | POA: Diagnosis not present

## 2016-11-05 DIAGNOSIS — R918 Other nonspecific abnormal finding of lung field: Secondary | ICD-10-CM | POA: Diagnosis not present

## 2016-11-05 DIAGNOSIS — I9581 Postprocedural hypotension: Secondary | ICD-10-CM | POA: Diagnosis not present

## 2016-11-05 DIAGNOSIS — E876 Hypokalemia: Secondary | ICD-10-CM | POA: Diagnosis not present

## 2016-11-05 DIAGNOSIS — C562 Malignant neoplasm of left ovary: Secondary | ICD-10-CM | POA: Diagnosis not present

## 2016-11-05 DIAGNOSIS — Z85828 Personal history of other malignant neoplasm of skin: Secondary | ICD-10-CM | POA: Diagnosis not present

## 2016-11-05 DIAGNOSIS — J441 Chronic obstructive pulmonary disease with (acute) exacerbation: Secondary | ICD-10-CM | POA: Diagnosis not present

## 2016-11-05 DIAGNOSIS — N9489 Other specified conditions associated with female genital organs and menstrual cycle: Secondary | ICD-10-CM | POA: Diagnosis not present

## 2016-11-05 DIAGNOSIS — D3912 Neoplasm of uncertain behavior of left ovary: Secondary | ICD-10-CM | POA: Diagnosis not present

## 2016-11-05 DIAGNOSIS — Z87891 Personal history of nicotine dependence: Secondary | ICD-10-CM | POA: Diagnosis not present

## 2016-11-05 DIAGNOSIS — J90 Pleural effusion, not elsewhere classified: Secondary | ICD-10-CM | POA: Diagnosis not present

## 2016-11-05 DIAGNOSIS — N83291 Other ovarian cyst, right side: Secondary | ICD-10-CM | POA: Diagnosis not present

## 2016-11-05 DIAGNOSIS — R1110 Vomiting, unspecified: Secondary | ICD-10-CM | POA: Diagnosis not present

## 2016-11-05 DIAGNOSIS — M6281 Muscle weakness (generalized): Secondary | ICD-10-CM | POA: Diagnosis not present

## 2016-11-05 DIAGNOSIS — K567 Ileus, unspecified: Secondary | ICD-10-CM | POA: Diagnosis not present

## 2016-11-05 DIAGNOSIS — R590 Localized enlarged lymph nodes: Secondary | ICD-10-CM | POA: Diagnosis not present

## 2016-11-05 DIAGNOSIS — D62 Acute posthemorrhagic anemia: Secondary | ICD-10-CM | POA: Diagnosis not present

## 2016-11-05 DIAGNOSIS — R1900 Intra-abdominal and pelvic swelling, mass and lump, unspecified site: Secondary | ICD-10-CM | POA: Diagnosis not present

## 2016-11-05 DIAGNOSIS — E785 Hyperlipidemia, unspecified: Secondary | ICD-10-CM | POA: Diagnosis present

## 2016-11-05 DIAGNOSIS — J449 Chronic obstructive pulmonary disease, unspecified: Secondary | ICD-10-CM | POA: Diagnosis not present

## 2016-11-05 DIAGNOSIS — J952 Acute pulmonary insufficiency following nonthoracic surgery: Secondary | ICD-10-CM | POA: Diagnosis not present

## 2016-11-05 DIAGNOSIS — J439 Emphysema, unspecified: Secondary | ICD-10-CM | POA: Diagnosis not present

## 2016-11-05 DIAGNOSIS — S2243XA Multiple fractures of ribs, bilateral, initial encounter for closed fracture: Secondary | ICD-10-CM | POA: Diagnosis not present

## 2016-11-05 DIAGNOSIS — F329 Major depressive disorder, single episode, unspecified: Secondary | ICD-10-CM | POA: Diagnosis present

## 2016-11-05 DIAGNOSIS — R278 Other lack of coordination: Secondary | ICD-10-CM | POA: Diagnosis not present

## 2016-11-05 DIAGNOSIS — Z7982 Long term (current) use of aspirin: Secondary | ICD-10-CM | POA: Diagnosis not present

## 2016-11-05 DIAGNOSIS — J984 Other disorders of lung: Secondary | ICD-10-CM | POA: Diagnosis present

## 2016-11-05 DIAGNOSIS — Z9889 Other specified postprocedural states: Secondary | ICD-10-CM | POA: Diagnosis not present

## 2016-11-05 DIAGNOSIS — D696 Thrombocytopenia, unspecified: Secondary | ICD-10-CM | POA: Diagnosis not present

## 2016-11-05 DIAGNOSIS — I10 Essential (primary) hypertension: Secondary | ICD-10-CM | POA: Diagnosis present

## 2016-11-05 DIAGNOSIS — R109 Unspecified abdominal pain: Secondary | ICD-10-CM | POA: Diagnosis not present

## 2016-11-12 DIAGNOSIS — M1990 Unspecified osteoarthritis, unspecified site: Secondary | ICD-10-CM | POA: Diagnosis not present

## 2016-11-12 DIAGNOSIS — C569 Malignant neoplasm of unspecified ovary: Secondary | ICD-10-CM | POA: Diagnosis not present

## 2016-11-12 DIAGNOSIS — Z9889 Other specified postprocedural states: Secondary | ICD-10-CM | POA: Diagnosis not present

## 2016-11-12 DIAGNOSIS — Z483 Aftercare following surgery for neoplasm: Secondary | ICD-10-CM | POA: Diagnosis not present

## 2016-11-12 DIAGNOSIS — I808 Phlebitis and thrombophlebitis of other sites: Secondary | ICD-10-CM | POA: Diagnosis not present

## 2016-11-12 DIAGNOSIS — D4989 Neoplasm of unspecified behavior of other specified sites: Secondary | ICD-10-CM | POA: Diagnosis not present

## 2016-11-12 DIAGNOSIS — D3910 Neoplasm of uncertain behavior of unspecified ovary: Secondary | ICD-10-CM | POA: Diagnosis not present

## 2016-11-12 DIAGNOSIS — J441 Chronic obstructive pulmonary disease with (acute) exacerbation: Secondary | ICD-10-CM | POA: Diagnosis not present

## 2016-11-12 DIAGNOSIS — E876 Hypokalemia: Secondary | ICD-10-CM | POA: Diagnosis not present

## 2016-11-12 DIAGNOSIS — R2689 Other abnormalities of gait and mobility: Secondary | ICD-10-CM | POA: Diagnosis not present

## 2016-11-12 DIAGNOSIS — Z90722 Acquired absence of ovaries, bilateral: Secondary | ICD-10-CM | POA: Diagnosis not present

## 2016-11-12 DIAGNOSIS — M6281 Muscle weakness (generalized): Secondary | ICD-10-CM | POA: Diagnosis not present

## 2016-11-12 DIAGNOSIS — E785 Hyperlipidemia, unspecified: Secondary | ICD-10-CM | POA: Diagnosis not present

## 2016-11-12 DIAGNOSIS — T8130XA Disruption of wound, unspecified, initial encounter: Secondary | ICD-10-CM | POA: Diagnosis not present

## 2016-11-12 DIAGNOSIS — F322 Major depressive disorder, single episode, severe without psychotic features: Secondary | ICD-10-CM | POA: Diagnosis not present

## 2016-11-12 DIAGNOSIS — F329 Major depressive disorder, single episode, unspecified: Secondary | ICD-10-CM | POA: Diagnosis not present

## 2016-11-12 DIAGNOSIS — R1900 Intra-abdominal and pelvic swelling, mass and lump, unspecified site: Secondary | ICD-10-CM | POA: Diagnosis not present

## 2016-11-12 DIAGNOSIS — J449 Chronic obstructive pulmonary disease, unspecified: Secondary | ICD-10-CM | POA: Diagnosis not present

## 2016-11-12 DIAGNOSIS — R278 Other lack of coordination: Secondary | ICD-10-CM | POA: Diagnosis not present

## 2016-11-12 DIAGNOSIS — I10 Essential (primary) hypertension: Secondary | ICD-10-CM | POA: Diagnosis not present

## 2016-11-13 DIAGNOSIS — J449 Chronic obstructive pulmonary disease, unspecified: Secondary | ICD-10-CM | POA: Diagnosis not present

## 2016-11-13 DIAGNOSIS — D4989 Neoplasm of unspecified behavior of other specified sites: Secondary | ICD-10-CM | POA: Diagnosis not present

## 2016-11-13 DIAGNOSIS — E785 Hyperlipidemia, unspecified: Secondary | ICD-10-CM | POA: Diagnosis not present

## 2016-11-13 DIAGNOSIS — I10 Essential (primary) hypertension: Secondary | ICD-10-CM | POA: Diagnosis not present

## 2016-11-15 DIAGNOSIS — M1990 Unspecified osteoarthritis, unspecified site: Secondary | ICD-10-CM | POA: Diagnosis not present

## 2016-11-15 DIAGNOSIS — I10 Essential (primary) hypertension: Secondary | ICD-10-CM | POA: Diagnosis not present

## 2016-11-15 DIAGNOSIS — J449 Chronic obstructive pulmonary disease, unspecified: Secondary | ICD-10-CM | POA: Diagnosis not present

## 2016-11-15 DIAGNOSIS — F322 Major depressive disorder, single episode, severe without psychotic features: Secondary | ICD-10-CM | POA: Diagnosis not present

## 2016-11-20 DIAGNOSIS — I808 Phlebitis and thrombophlebitis of other sites: Secondary | ICD-10-CM | POA: Diagnosis not present

## 2016-11-20 DIAGNOSIS — Z9889 Other specified postprocedural states: Secondary | ICD-10-CM | POA: Diagnosis not present

## 2016-11-20 DIAGNOSIS — Z90722 Acquired absence of ovaries, bilateral: Secondary | ICD-10-CM | POA: Diagnosis not present

## 2016-11-20 DIAGNOSIS — T8130XA Disruption of wound, unspecified, initial encounter: Secondary | ICD-10-CM | POA: Diagnosis not present

## 2016-11-20 DIAGNOSIS — D3910 Neoplasm of uncertain behavior of unspecified ovary: Secondary | ICD-10-CM | POA: Diagnosis not present

## 2016-11-20 DIAGNOSIS — E876 Hypokalemia: Secondary | ICD-10-CM | POA: Diagnosis not present

## 2016-11-22 DIAGNOSIS — J449 Chronic obstructive pulmonary disease, unspecified: Secondary | ICD-10-CM | POA: Diagnosis not present

## 2016-11-22 DIAGNOSIS — I10 Essential (primary) hypertension: Secondary | ICD-10-CM | POA: Diagnosis not present

## 2016-11-22 DIAGNOSIS — F329 Major depressive disorder, single episode, unspecified: Secondary | ICD-10-CM | POA: Diagnosis not present

## 2016-11-22 DIAGNOSIS — D4989 Neoplasm of unspecified behavior of other specified sites: Secondary | ICD-10-CM | POA: Diagnosis not present

## 2016-11-26 ENCOUNTER — Other Ambulatory Visit: Payer: Self-pay | Admitting: *Deleted

## 2016-11-26 NOTE — Patient Outreach (Signed)
[facility] ([facility]) Care Management  11/26/2016  Susan Bradford 11/06/1933 [id number]   [facility] ([facility] Care Management Post-Acute Care Coordination  11/26/2016  Susan Bradford 08/02/1934 [id number]   Met with Melissa Vickers, LCSW for [facility], Reviewed patient case. She confirms that patient will remain as a Long Term Care resident of facility and there are no plans to discharge at this time.  RNCM will sign off case.  Mary E. Niemczura, RN, BSN, CCM  [facility] Post-Acute Care Coordinator [phone] 

## 2016-12-04 DIAGNOSIS — Z483 Aftercare following surgery for neoplasm: Secondary | ICD-10-CM | POA: Diagnosis not present

## 2016-12-04 DIAGNOSIS — Z9889 Other specified postprocedural states: Secondary | ICD-10-CM | POA: Diagnosis not present

## 2016-12-04 DIAGNOSIS — C569 Malignant neoplasm of unspecified ovary: Secondary | ICD-10-CM | POA: Diagnosis not present

## 2016-12-05 DIAGNOSIS — J449 Chronic obstructive pulmonary disease, unspecified: Secondary | ICD-10-CM | POA: Diagnosis not present

## 2016-12-05 DIAGNOSIS — F329 Major depressive disorder, single episode, unspecified: Secondary | ICD-10-CM | POA: Diagnosis not present

## 2016-12-05 DIAGNOSIS — D4989 Neoplasm of unspecified behavior of other specified sites: Secondary | ICD-10-CM | POA: Diagnosis not present

## 2016-12-05 DIAGNOSIS — I10 Essential (primary) hypertension: Secondary | ICD-10-CM | POA: Diagnosis not present

## 2016-12-08 DIAGNOSIS — Z87891 Personal history of nicotine dependence: Secondary | ICD-10-CM | POA: Diagnosis not present

## 2016-12-08 DIAGNOSIS — D649 Anemia, unspecified: Secondary | ICD-10-CM | POA: Diagnosis not present

## 2016-12-08 DIAGNOSIS — F329 Major depressive disorder, single episode, unspecified: Secondary | ICD-10-CM | POA: Diagnosis not present

## 2016-12-08 DIAGNOSIS — Z483 Aftercare following surgery for neoplasm: Secondary | ICD-10-CM | POA: Diagnosis not present

## 2016-12-08 DIAGNOSIS — I10 Essential (primary) hypertension: Secondary | ICD-10-CM | POA: Diagnosis not present

## 2016-12-08 DIAGNOSIS — J449 Chronic obstructive pulmonary disease, unspecified: Secondary | ICD-10-CM | POA: Diagnosis not present

## 2016-12-09 DIAGNOSIS — J449 Chronic obstructive pulmonary disease, unspecified: Secondary | ICD-10-CM | POA: Diagnosis not present

## 2016-12-09 DIAGNOSIS — Z87891 Personal history of nicotine dependence: Secondary | ICD-10-CM | POA: Diagnosis not present

## 2016-12-09 DIAGNOSIS — Z483 Aftercare following surgery for neoplasm: Secondary | ICD-10-CM | POA: Diagnosis not present

## 2016-12-09 DIAGNOSIS — F329 Major depressive disorder, single episode, unspecified: Secondary | ICD-10-CM | POA: Diagnosis not present

## 2016-12-09 DIAGNOSIS — D649 Anemia, unspecified: Secondary | ICD-10-CM | POA: Diagnosis not present

## 2016-12-09 DIAGNOSIS — I10 Essential (primary) hypertension: Secondary | ICD-10-CM | POA: Diagnosis not present

## 2016-12-11 DIAGNOSIS — N6092 Unspecified benign mammary dysplasia of left breast: Secondary | ICD-10-CM | POA: Diagnosis not present

## 2016-12-11 DIAGNOSIS — Z90710 Acquired absence of both cervix and uterus: Secondary | ICD-10-CM | POA: Diagnosis not present

## 2016-12-11 DIAGNOSIS — N6082 Other benign mammary dysplasias of left breast: Secondary | ICD-10-CM | POA: Diagnosis not present

## 2016-12-11 DIAGNOSIS — Z90721 Acquired absence of ovaries, unilateral: Secondary | ICD-10-CM | POA: Diagnosis not present

## 2016-12-16 DIAGNOSIS — J449 Chronic obstructive pulmonary disease, unspecified: Secondary | ICD-10-CM | POA: Diagnosis not present

## 2016-12-16 DIAGNOSIS — Z483 Aftercare following surgery for neoplasm: Secondary | ICD-10-CM | POA: Diagnosis not present

## 2016-12-16 DIAGNOSIS — I10 Essential (primary) hypertension: Secondary | ICD-10-CM | POA: Diagnosis not present

## 2016-12-16 DIAGNOSIS — Z87891 Personal history of nicotine dependence: Secondary | ICD-10-CM | POA: Diagnosis not present

## 2016-12-16 DIAGNOSIS — F329 Major depressive disorder, single episode, unspecified: Secondary | ICD-10-CM | POA: Diagnosis not present

## 2016-12-16 DIAGNOSIS — D649 Anemia, unspecified: Secondary | ICD-10-CM | POA: Diagnosis not present

## 2016-12-18 DIAGNOSIS — J449 Chronic obstructive pulmonary disease, unspecified: Secondary | ICD-10-CM | POA: Diagnosis not present

## 2016-12-18 DIAGNOSIS — Z483 Aftercare following surgery for neoplasm: Secondary | ICD-10-CM | POA: Diagnosis not present

## 2016-12-18 DIAGNOSIS — F329 Major depressive disorder, single episode, unspecified: Secondary | ICD-10-CM | POA: Diagnosis not present

## 2016-12-18 DIAGNOSIS — I10 Essential (primary) hypertension: Secondary | ICD-10-CM | POA: Diagnosis not present

## 2016-12-25 DIAGNOSIS — Z87891 Personal history of nicotine dependence: Secondary | ICD-10-CM | POA: Diagnosis not present

## 2016-12-25 DIAGNOSIS — D649 Anemia, unspecified: Secondary | ICD-10-CM | POA: Diagnosis not present

## 2016-12-25 DIAGNOSIS — F329 Major depressive disorder, single episode, unspecified: Secondary | ICD-10-CM | POA: Diagnosis not present

## 2016-12-25 DIAGNOSIS — Z483 Aftercare following surgery for neoplasm: Secondary | ICD-10-CM | POA: Diagnosis not present

## 2016-12-25 DIAGNOSIS — I10 Essential (primary) hypertension: Secondary | ICD-10-CM | POA: Diagnosis not present

## 2016-12-25 DIAGNOSIS — J449 Chronic obstructive pulmonary disease, unspecified: Secondary | ICD-10-CM | POA: Diagnosis not present

## 2017-01-07 DIAGNOSIS — D649 Anemia, unspecified: Secondary | ICD-10-CM | POA: Diagnosis not present

## 2017-01-07 DIAGNOSIS — F329 Major depressive disorder, single episode, unspecified: Secondary | ICD-10-CM | POA: Diagnosis not present

## 2017-01-07 DIAGNOSIS — I10 Essential (primary) hypertension: Secondary | ICD-10-CM | POA: Diagnosis not present

## 2017-01-07 DIAGNOSIS — J449 Chronic obstructive pulmonary disease, unspecified: Secondary | ICD-10-CM | POA: Diagnosis not present

## 2017-01-07 DIAGNOSIS — Z87891 Personal history of nicotine dependence: Secondary | ICD-10-CM | POA: Diagnosis not present

## 2017-01-07 DIAGNOSIS — Z483 Aftercare following surgery for neoplasm: Secondary | ICD-10-CM | POA: Diagnosis not present

## 2017-01-24 ENCOUNTER — Ambulatory Visit: Payer: Medicare Other | Admitting: Pulmonary Disease

## 2017-03-31 DIAGNOSIS — C569 Malignant neoplasm of unspecified ovary: Secondary | ICD-10-CM | POA: Diagnosis not present

## 2017-04-09 DIAGNOSIS — I10 Essential (primary) hypertension: Secondary | ICD-10-CM | POA: Diagnosis not present

## 2017-04-09 DIAGNOSIS — M150 Primary generalized (osteo)arthritis: Secondary | ICD-10-CM | POA: Diagnosis not present

## 2017-04-09 DIAGNOSIS — E782 Mixed hyperlipidemia: Secondary | ICD-10-CM | POA: Diagnosis not present

## 2017-04-09 DIAGNOSIS — Z0000 Encounter for general adult medical examination without abnormal findings: Secondary | ICD-10-CM | POA: Diagnosis not present

## 2017-04-15 DIAGNOSIS — N183 Chronic kidney disease, stage 3 (moderate): Secondary | ICD-10-CM | POA: Diagnosis not present

## 2017-04-15 DIAGNOSIS — E782 Mixed hyperlipidemia: Secondary | ICD-10-CM | POA: Diagnosis not present

## 2017-04-15 DIAGNOSIS — J449 Chronic obstructive pulmonary disease, unspecified: Secondary | ICD-10-CM | POA: Diagnosis not present

## 2017-04-15 DIAGNOSIS — I129 Hypertensive chronic kidney disease with stage 1 through stage 4 chronic kidney disease, or unspecified chronic kidney disease: Secondary | ICD-10-CM | POA: Diagnosis not present

## 2017-05-05 DIAGNOSIS — C569 Malignant neoplasm of unspecified ovary: Secondary | ICD-10-CM | POA: Diagnosis not present

## 2017-05-05 DIAGNOSIS — R5383 Other fatigue: Secondary | ICD-10-CM | POA: Diagnosis not present

## 2017-05-05 DIAGNOSIS — C562 Malignant neoplasm of left ovary: Secondary | ICD-10-CM | POA: Diagnosis not present

## 2017-06-11 DIAGNOSIS — N6092 Unspecified benign mammary dysplasia of left breast: Secondary | ICD-10-CM | POA: Diagnosis not present

## 2017-08-20 DIAGNOSIS — I10 Essential (primary) hypertension: Secondary | ICD-10-CM | POA: Diagnosis not present

## 2017-08-20 DIAGNOSIS — S2242XD Multiple fractures of ribs, left side, subsequent encounter for fracture with routine healing: Secondary | ICD-10-CM | POA: Diagnosis not present

## 2017-08-20 DIAGNOSIS — J449 Chronic obstructive pulmonary disease, unspecified: Secondary | ICD-10-CM | POA: Diagnosis not present

## 2017-08-20 DIAGNOSIS — F329 Major depressive disorder, single episode, unspecified: Secondary | ICD-10-CM | POA: Diagnosis not present

## 2017-10-21 DIAGNOSIS — I129 Hypertensive chronic kidney disease with stage 1 through stage 4 chronic kidney disease, or unspecified chronic kidney disease: Secondary | ICD-10-CM | POA: Diagnosis not present

## 2017-10-21 DIAGNOSIS — N183 Chronic kidney disease, stage 3 (moderate): Secondary | ICD-10-CM | POA: Diagnosis not present

## 2017-10-21 DIAGNOSIS — I10 Essential (primary) hypertension: Secondary | ICD-10-CM | POA: Diagnosis not present

## 2017-10-21 DIAGNOSIS — E782 Mixed hyperlipidemia: Secondary | ICD-10-CM | POA: Diagnosis not present

## 2017-10-28 DIAGNOSIS — E039 Hypothyroidism, unspecified: Secondary | ICD-10-CM | POA: Diagnosis not present

## 2017-10-28 DIAGNOSIS — E782 Mixed hyperlipidemia: Secondary | ICD-10-CM | POA: Diagnosis not present

## 2017-10-28 DIAGNOSIS — I129 Hypertensive chronic kidney disease with stage 1 through stage 4 chronic kidney disease, or unspecified chronic kidney disease: Secondary | ICD-10-CM | POA: Diagnosis not present

## 2017-12-23 DIAGNOSIS — E782 Mixed hyperlipidemia: Secondary | ICD-10-CM | POA: Diagnosis not present

## 2017-12-23 DIAGNOSIS — E039 Hypothyroidism, unspecified: Secondary | ICD-10-CM | POA: Diagnosis not present

## 2017-12-30 DIAGNOSIS — I129 Hypertensive chronic kidney disease with stage 1 through stage 4 chronic kidney disease, or unspecified chronic kidney disease: Secondary | ICD-10-CM | POA: Diagnosis not present

## 2017-12-30 DIAGNOSIS — N183 Chronic kidney disease, stage 3 (moderate): Secondary | ICD-10-CM | POA: Diagnosis not present

## 2017-12-30 DIAGNOSIS — E782 Mixed hyperlipidemia: Secondary | ICD-10-CM | POA: Diagnosis not present

## 2017-12-30 DIAGNOSIS — R21 Rash and other nonspecific skin eruption: Secondary | ICD-10-CM | POA: Diagnosis not present

## 2017-12-30 DIAGNOSIS — E039 Hypothyroidism, unspecified: Secondary | ICD-10-CM | POA: Diagnosis not present

## 2018-02-10 DIAGNOSIS — M50322 Other cervical disc degeneration at C5-C6 level: Secondary | ICD-10-CM | POA: Diagnosis not present

## 2018-02-10 DIAGNOSIS — M5134 Other intervertebral disc degeneration, thoracic region: Secondary | ICD-10-CM | POA: Diagnosis not present

## 2018-02-10 DIAGNOSIS — M9902 Segmental and somatic dysfunction of thoracic region: Secondary | ICD-10-CM | POA: Diagnosis not present

## 2018-02-10 DIAGNOSIS — M9901 Segmental and somatic dysfunction of cervical region: Secondary | ICD-10-CM | POA: Diagnosis not present

## 2018-02-11 DIAGNOSIS — M9902 Segmental and somatic dysfunction of thoracic region: Secondary | ICD-10-CM | POA: Diagnosis not present

## 2018-02-11 DIAGNOSIS — M50322 Other cervical disc degeneration at C5-C6 level: Secondary | ICD-10-CM | POA: Diagnosis not present

## 2018-02-11 DIAGNOSIS — M5134 Other intervertebral disc degeneration, thoracic region: Secondary | ICD-10-CM | POA: Diagnosis not present

## 2018-02-11 DIAGNOSIS — M9901 Segmental and somatic dysfunction of cervical region: Secondary | ICD-10-CM | POA: Diagnosis not present

## 2018-02-12 DIAGNOSIS — M9901 Segmental and somatic dysfunction of cervical region: Secondary | ICD-10-CM | POA: Diagnosis not present

## 2018-02-12 DIAGNOSIS — M9902 Segmental and somatic dysfunction of thoracic region: Secondary | ICD-10-CM | POA: Diagnosis not present

## 2018-02-12 DIAGNOSIS — M50322 Other cervical disc degeneration at C5-C6 level: Secondary | ICD-10-CM | POA: Diagnosis not present

## 2018-02-12 DIAGNOSIS — M5134 Other intervertebral disc degeneration, thoracic region: Secondary | ICD-10-CM | POA: Diagnosis not present

## 2018-02-16 DIAGNOSIS — N183 Chronic kidney disease, stage 3 (moderate): Secondary | ICD-10-CM | POA: Diagnosis not present

## 2018-02-16 DIAGNOSIS — R0781 Pleurodynia: Secondary | ICD-10-CM | POA: Diagnosis not present

## 2018-02-16 DIAGNOSIS — R918 Other nonspecific abnormal finding of lung field: Secondary | ICD-10-CM | POA: Diagnosis not present

## 2018-02-16 DIAGNOSIS — M546 Pain in thoracic spine: Secondary | ICD-10-CM | POA: Diagnosis not present

## 2018-02-18 DIAGNOSIS — N183 Chronic kidney disease, stage 3 (moderate): Secondary | ICD-10-CM | POA: Diagnosis not present

## 2018-02-19 ENCOUNTER — Other Ambulatory Visit: Payer: Self-pay | Admitting: Internal Medicine

## 2018-02-19 DIAGNOSIS — R918 Other nonspecific abnormal finding of lung field: Secondary | ICD-10-CM

## 2018-02-23 ENCOUNTER — Emergency Department (HOSPITAL_COMMUNITY): Payer: Medicare Other

## 2018-02-23 ENCOUNTER — Encounter (HOSPITAL_COMMUNITY): Payer: Self-pay | Admitting: Emergency Medicine

## 2018-02-23 ENCOUNTER — Inpatient Hospital Stay (HOSPITAL_COMMUNITY)
Admission: EM | Admit: 2018-02-23 | Discharge: 2018-04-05 | DRG: 456 | Disposition: E | Payer: Medicare Other | Attending: Internal Medicine | Admitting: Internal Medicine

## 2018-02-23 DIAGNOSIS — Z809 Family history of malignant neoplasm, unspecified: Secondary | ICD-10-CM

## 2018-02-23 DIAGNOSIS — M549 Dorsalgia, unspecified: Secondary | ICD-10-CM | POA: Diagnosis not present

## 2018-02-23 DIAGNOSIS — G9529 Other cord compression: Secondary | ICD-10-CM | POA: Diagnosis present

## 2018-02-23 DIAGNOSIS — R918 Other nonspecific abnormal finding of lung field: Secondary | ICD-10-CM | POA: Diagnosis not present

## 2018-02-23 DIAGNOSIS — G992 Myelopathy in diseases classified elsewhere: Secondary | ICD-10-CM | POA: Diagnosis present

## 2018-02-23 DIAGNOSIS — G9589 Other specified diseases of spinal cord: Secondary | ICD-10-CM

## 2018-02-23 DIAGNOSIS — Z79890 Hormone replacement therapy (postmenopausal): Secondary | ICD-10-CM

## 2018-02-23 DIAGNOSIS — Z885 Allergy status to narcotic agent status: Secondary | ICD-10-CM

## 2018-02-23 DIAGNOSIS — J449 Chronic obstructive pulmonary disease, unspecified: Secondary | ICD-10-CM | POA: Diagnosis present

## 2018-02-23 DIAGNOSIS — M4804 Spinal stenosis, thoracic region: Secondary | ICD-10-CM | POA: Diagnosis present

## 2018-02-23 DIAGNOSIS — C569 Malignant neoplasm of unspecified ovary: Secondary | ICD-10-CM | POA: Diagnosis present

## 2018-02-23 DIAGNOSIS — D492 Neoplasm of unspecified behavior of bone, soft tissue, and skin: Secondary | ICD-10-CM

## 2018-02-23 DIAGNOSIS — G893 Neoplasm related pain (acute) (chronic): Secondary | ICD-10-CM | POA: Diagnosis present

## 2018-02-23 DIAGNOSIS — M5489 Other dorsalgia: Secondary | ICD-10-CM | POA: Diagnosis not present

## 2018-02-23 DIAGNOSIS — R6521 Severe sepsis with septic shock: Secondary | ICD-10-CM | POA: Diagnosis not present

## 2018-02-23 DIAGNOSIS — R0902 Hypoxemia: Secondary | ICD-10-CM | POA: Diagnosis not present

## 2018-02-23 DIAGNOSIS — Z791 Long term (current) use of non-steroidal anti-inflammatories (NSAID): Secondary | ICD-10-CM

## 2018-02-23 DIAGNOSIS — F329 Major depressive disorder, single episode, unspecified: Secondary | ICD-10-CM | POA: Diagnosis present

## 2018-02-23 DIAGNOSIS — C562 Malignant neoplasm of left ovary: Secondary | ICD-10-CM | POA: Diagnosis present

## 2018-02-23 DIAGNOSIS — G9341 Metabolic encephalopathy: Secondary | ICD-10-CM | POA: Diagnosis not present

## 2018-02-23 DIAGNOSIS — I10 Essential (primary) hypertension: Secondary | ICD-10-CM | POA: Diagnosis present

## 2018-02-23 DIAGNOSIS — M8448XA Pathological fracture, other site, initial encounter for fracture: Secondary | ICD-10-CM | POA: Diagnosis present

## 2018-02-23 DIAGNOSIS — C7800 Secondary malignant neoplasm of unspecified lung: Secondary | ICD-10-CM | POA: Diagnosis not present

## 2018-02-23 DIAGNOSIS — M419 Scoliosis, unspecified: Secondary | ICD-10-CM | POA: Diagnosis present

## 2018-02-23 DIAGNOSIS — J439 Emphysema, unspecified: Secondary | ICD-10-CM | POA: Diagnosis not present

## 2018-02-23 DIAGNOSIS — G959 Disease of spinal cord, unspecified: Secondary | ICD-10-CM | POA: Diagnosis not present

## 2018-02-23 DIAGNOSIS — Z90710 Acquired absence of both cervix and uterus: Secondary | ICD-10-CM

## 2018-02-23 DIAGNOSIS — Z66 Do not resuscitate: Secondary | ICD-10-CM | POA: Diagnosis present

## 2018-02-23 DIAGNOSIS — K5909 Other constipation: Secondary | ICD-10-CM | POA: Diagnosis present

## 2018-02-23 DIAGNOSIS — J80 Acute respiratory distress syndrome: Secondary | ICD-10-CM | POA: Diagnosis not present

## 2018-02-23 DIAGNOSIS — M532X9 Spinal instabilities, site unspecified: Secondary | ICD-10-CM | POA: Diagnosis present

## 2018-02-23 DIAGNOSIS — D497 Neoplasm of unspecified behavior of endocrine glands and other parts of nervous system: Secondary | ICD-10-CM

## 2018-02-23 DIAGNOSIS — Z6823 Body mass index (BMI) 23.0-23.9, adult: Secondary | ICD-10-CM

## 2018-02-23 DIAGNOSIS — R1900 Intra-abdominal and pelvic swelling, mass and lump, unspecified site: Secondary | ICD-10-CM

## 2018-02-23 DIAGNOSIS — Z79899 Other long term (current) drug therapy: Secondary | ICD-10-CM

## 2018-02-23 DIAGNOSIS — A419 Sepsis, unspecified organism: Secondary | ICD-10-CM | POA: Diagnosis not present

## 2018-02-23 DIAGNOSIS — R630 Anorexia: Secondary | ICD-10-CM | POA: Diagnosis present

## 2018-02-23 DIAGNOSIS — Z7189 Other specified counseling: Secondary | ICD-10-CM

## 2018-02-23 DIAGNOSIS — I712 Thoracic aortic aneurysm, without rupture: Secondary | ICD-10-CM | POA: Diagnosis present

## 2018-02-23 DIAGNOSIS — Z91048 Other nonmedicinal substance allergy status: Secondary | ICD-10-CM

## 2018-02-23 DIAGNOSIS — Z87891 Personal history of nicotine dependence: Secondary | ICD-10-CM

## 2018-02-23 DIAGNOSIS — R140 Abdominal distension (gaseous): Secondary | ICD-10-CM

## 2018-02-23 DIAGNOSIS — C3490 Malignant neoplasm of unspecified part of unspecified bronchus or lung: Secondary | ICD-10-CM | POA: Diagnosis not present

## 2018-02-23 DIAGNOSIS — C7951 Secondary malignant neoplasm of bone: Secondary | ICD-10-CM | POA: Diagnosis not present

## 2018-02-23 DIAGNOSIS — Z7951 Long term (current) use of inhaled steroids: Secondary | ICD-10-CM

## 2018-02-23 DIAGNOSIS — E785 Hyperlipidemia, unspecified: Secondary | ICD-10-CM | POA: Diagnosis present

## 2018-02-23 DIAGNOSIS — Z515 Encounter for palliative care: Secondary | ICD-10-CM | POA: Diagnosis present

## 2018-02-23 DIAGNOSIS — E039 Hypothyroidism, unspecified: Secondary | ICD-10-CM | POA: Diagnosis present

## 2018-02-23 DIAGNOSIS — M546 Pain in thoracic spine: Secondary | ICD-10-CM | POA: Diagnosis not present

## 2018-02-23 DIAGNOSIS — F411 Generalized anxiety disorder: Secondary | ICD-10-CM | POA: Diagnosis present

## 2018-02-23 HISTORY — DX: Pure hypercholesterolemia, unspecified: E78.00

## 2018-02-23 HISTORY — DX: Malignant (primary) neoplasm, unspecified: C80.1

## 2018-02-23 HISTORY — DX: Essential (primary) hypertension: I10

## 2018-02-23 LAB — COMPREHENSIVE METABOLIC PANEL
ALBUMIN: 3.5 g/dL (ref 3.5–5.0)
ALT: 28 U/L (ref 0–44)
ANION GAP: 11 (ref 5–15)
AST: 25 U/L (ref 15–41)
Alkaline Phosphatase: 104 U/L (ref 38–126)
BUN: 21 mg/dL (ref 8–23)
CHLORIDE: 94 mmol/L — AB (ref 98–111)
CO2: 30 mmol/L (ref 22–32)
Calcium: 11.2 mg/dL — ABNORMAL HIGH (ref 8.9–10.3)
Creatinine, Ser: 0.7 mg/dL (ref 0.44–1.00)
GFR calc Af Amer: >60 mL/min (ref >60)
GFR calc non Af Amer: >60 mL/min (ref >60)
GLUCOSE: 132 mg/dL — AB (ref 70–99)
POTASSIUM: 3.8 mmol/L (ref 3.5–5.1)
Sodium: 135 mmol/L (ref 135–145)
Total Bilirubin: 0.9 mg/dL (ref 0.3–1.2)
Total Protein: 7.2 g/dL (ref 6.5–8.1)

## 2018-02-23 LAB — URINALYSIS, ROUTINE W REFLEX MICROSCOPIC
Bilirubin Urine: NEGATIVE
GLUCOSE, UA: NEGATIVE mg/dL
Hgb urine dipstick: NEGATIVE
KETONES UR: 5 mg/dL — AB
LEUKOCYTES UA: NEGATIVE
Nitrite: NEGATIVE
PROTEIN: NEGATIVE mg/dL
Specific Gravity, Urine: 1.016 (ref 1.005–1.030)
pH: 5 (ref 5.0–8.0)

## 2018-02-23 LAB — CBC WITH DIFFERENTIAL/PLATELET
Basophils Absolute: 0 10*3/uL (ref 0.0–0.1)
Basophils Relative: 0 %
EOS PCT: 0 %
Eosinophils Absolute: 0 10*3/uL (ref 0.0–0.7)
HEMATOCRIT: 39.4 % (ref 36.0–46.0)
Hemoglobin: 12.9 g/dL (ref 12.0–15.0)
LYMPHS ABS: 0.5 10*3/uL — AB (ref 0.7–4.0)
LYMPHS PCT: 7 %
MCH: 28.8 pg (ref 26.0–34.0)
MCHC: 32.7 g/dL (ref 30.0–36.0)
MCV: 87.9 fL (ref 78.0–100.0)
Monocytes Absolute: 0.3 10*3/uL (ref 0.1–1.0)
Monocytes Relative: 4 %
NEUTROS ABS: 6.5 10*3/uL (ref 1.7–7.7)
Neutrophils Relative %: 89 %
PLATELETS: 212 10*3/uL (ref 150–400)
RBC: 4.48 MIL/uL (ref 3.87–5.11)
RDW: 13.4 % (ref 11.5–15.5)
WBC: 7.3 10*3/uL (ref 4.0–10.5)

## 2018-02-23 LAB — TROPONIN I: Troponin I: <0.03 ng/mL

## 2018-02-23 LAB — LIPASE, BLOOD: LIPASE: 24 U/L (ref 11–51)

## 2018-02-23 MED ORDER — HYDROMORPHONE HCL 1 MG/ML IJ SOLN
1.0000 mg | INTRAMUSCULAR | Status: DC | PRN
Start: 2018-02-23 — End: 2018-02-23
  Filled 2018-02-23: qty 1

## 2018-02-23 MED ORDER — SERTRALINE HCL 50 MG PO TABS
50.0000 mg | ORAL_TABLET | Freq: Every day | ORAL | Status: DC
  Administered 2018-02-24 – 2018-03-07 (×12): 50 mg via ORAL
  Filled 2018-02-23 (×12): qty 1

## 2018-02-23 MED ORDER — LORAZEPAM 1 MG PO TABS
1.0000 mg | ORAL_TABLET | Freq: Three times a day (TID) | ORAL | Status: DC
  Administered 2018-02-24 – 2018-03-01 (×12): 1 mg via ORAL
  Filled 2018-02-23 (×13): qty 1

## 2018-02-23 MED ORDER — ATORVASTATIN CALCIUM 40 MG PO TABS
80.0000 mg | ORAL_TABLET | Freq: Every day | ORAL | Status: DC
  Administered 2018-02-25 – 2018-03-07 (×11): 80 mg via ORAL
  Filled 2018-02-23 (×13): qty 2

## 2018-02-23 MED ORDER — FENTANYL CITRATE (PF) 100 MCG/2ML IJ SOLN
100.0000 ug | INTRAMUSCULAR | Status: DC | PRN
  Administered 2018-02-23: 100 ug via INTRAVENOUS
  Filled 2018-02-23: qty 2

## 2018-02-23 MED ORDER — FENTANYL CITRATE (PF) 100 MCG/2ML IJ SOLN
50.0000 ug | Freq: Once | INTRAMUSCULAR | Status: AC
  Administered 2018-02-23: 50 ug via INTRAVENOUS
  Filled 2018-02-23: qty 2

## 2018-02-23 MED ORDER — MOMETASONE FURO-FORMOTEROL FUM 200-5 MCG/ACT IN AERO
2.0000 | INHALATION_SPRAY | Freq: Two times a day (BID) | RESPIRATORY_TRACT | Status: DC
  Administered 2018-02-24 – 2018-03-07 (×18): 2 via RESPIRATORY_TRACT
  Filled 2018-02-23 (×3): qty 8.8

## 2018-02-23 MED ORDER — ONDANSETRON HCL 4 MG/2ML IJ SOLN
4.0000 mg | Freq: Once | INTRAMUSCULAR | Status: AC
  Administered 2018-02-23: 4 mg via INTRAVENOUS
  Filled 2018-02-23: qty 2

## 2018-02-23 MED ORDER — TRIAMTERENE-HCTZ 37.5-25 MG PO TABS
1.0000 | ORAL_TABLET | Freq: Every day | ORAL | Status: DC
  Administered 2018-02-24: 1 via ORAL
  Filled 2018-02-23: qty 1

## 2018-02-23 MED ORDER — LEVOTHYROXINE SODIUM 50 MCG PO TABS
50.0000 ug | ORAL_TABLET | Freq: Every day | ORAL | Status: DC
  Administered 2018-02-24 – 2018-03-08 (×12): 50 ug via ORAL
  Filled 2018-02-23 (×12): qty 1

## 2018-02-23 MED ORDER — IOPAMIDOL (ISOVUE-300) INJECTION 61%
75.0000 mL | Freq: Once | INTRAVENOUS | Status: AC | PRN
  Administered 2018-02-23: 75 mL via INTRAVENOUS
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
## 2018-02-23 NOTE — ED Notes (Signed)
Patient transported to X-ray 

## 2018-02-23 NOTE — ED Triage Notes (Signed)
Per [facility] pt from home. Pt c/o mid-upper back pain that started Feb last year but got worse today. Pt went to Chiropractor last week and had her back re-aligned. Feels he may have been to hard and today she slept in later than her usual so dont know if that is what caused her back to hurt worse.   Pt now nauseated when moved from EMS stretcher to wheelchair.

## 2018-02-24 ENCOUNTER — Other Ambulatory Visit: Payer: Self-pay

## 2018-02-24 ENCOUNTER — Ambulatory Visit
Admit: 2018-02-24 | Discharge: 2018-02-24 | Disposition: A | Discharge status: Home / Self Care | Payer: Medicare Other | Attending: Radiation Oncology | Admitting: Radiation Oncology

## 2018-02-24 ENCOUNTER — Ambulatory Visit: Admit: 2018-02-24 | Payer: Medicare Other | Admitting: Radiation Oncology

## 2018-02-24 ENCOUNTER — Inpatient Hospital Stay (HOSPITAL_COMMUNITY): Payer: Medicare Other

## 2018-02-24 ENCOUNTER — Encounter: Payer: Self-pay | Admitting: Radiation Oncology

## 2018-02-24 ENCOUNTER — Encounter (HOSPITAL_COMMUNITY): Payer: Self-pay | Admitting: Gynecologic Oncology

## 2018-02-24 DIAGNOSIS — M899 Disorder of bone, unspecified: Secondary | ICD-10-CM | POA: Diagnosis not present

## 2018-02-24 DIAGNOSIS — M47814 Spondylosis without myelopathy or radiculopathy, thoracic region: Secondary | ICD-10-CM | POA: Diagnosis not present

## 2018-02-24 DIAGNOSIS — G893 Neoplasm related pain (acute) (chronic): Secondary | ICD-10-CM | POA: Diagnosis not present

## 2018-02-24 DIAGNOSIS — M8448XA Pathological fracture, other site, initial encounter for fracture: Secondary | ICD-10-CM | POA: Diagnosis present

## 2018-02-24 DIAGNOSIS — R634 Abnormal weight loss: Secondary | ICD-10-CM | POA: Diagnosis not present

## 2018-02-24 DIAGNOSIS — Z885 Allergy status to narcotic agent status: Secondary | ICD-10-CM | POA: Diagnosis not present

## 2018-02-24 DIAGNOSIS — E039 Hypothyroidism, unspecified: Secondary | ICD-10-CM | POA: Diagnosis present

## 2018-02-24 DIAGNOSIS — M4324 Fusion of spine, thoracic region: Secondary | ICD-10-CM | POA: Diagnosis not present

## 2018-02-24 DIAGNOSIS — G9341 Metabolic encephalopathy: Secondary | ICD-10-CM | POA: Diagnosis not present

## 2018-02-24 DIAGNOSIS — I10 Essential (primary) hypertension: Secondary | ICD-10-CM | POA: Diagnosis present

## 2018-02-24 DIAGNOSIS — R404 Transient alteration of awareness: Secondary | ICD-10-CM | POA: Diagnosis not present

## 2018-02-24 DIAGNOSIS — M8458XA Pathological fracture in neoplastic disease, other specified site, initial encounter for fracture: Secondary | ICD-10-CM | POA: Diagnosis not present

## 2018-02-24 DIAGNOSIS — Z87891 Personal history of nicotine dependence: Secondary | ICD-10-CM | POA: Diagnosis not present

## 2018-02-24 DIAGNOSIS — J432 Centrilobular emphysema: Secondary | ICD-10-CM | POA: Diagnosis not present

## 2018-02-24 DIAGNOSIS — R0902 Hypoxemia: Secondary | ICD-10-CM | POA: Diagnosis not present

## 2018-02-24 DIAGNOSIS — F411 Generalized anxiety disorder: Secondary | ICD-10-CM | POA: Diagnosis present

## 2018-02-24 DIAGNOSIS — Z515 Encounter for palliative care: Secondary | ICD-10-CM | POA: Diagnosis not present

## 2018-02-24 DIAGNOSIS — Z90710 Acquired absence of both cervix and uterus: Secondary | ICD-10-CM | POA: Diagnosis not present

## 2018-02-24 DIAGNOSIS — M546 Pain in thoracic spine: Secondary | ICD-10-CM | POA: Diagnosis not present

## 2018-02-24 DIAGNOSIS — Z66 Do not resuscitate: Secondary | ICD-10-CM | POA: Diagnosis present

## 2018-02-24 DIAGNOSIS — Z981 Arthrodesis status: Secondary | ICD-10-CM | POA: Diagnosis not present

## 2018-02-24 DIAGNOSIS — Z79899 Other long term (current) drug therapy: Secondary | ICD-10-CM | POA: Diagnosis not present

## 2018-02-24 DIAGNOSIS — E785 Hyperlipidemia, unspecified: Secondary | ICD-10-CM | POA: Diagnosis present

## 2018-02-24 DIAGNOSIS — R6521 Severe sepsis with septic shock: Secondary | ICD-10-CM | POA: Diagnosis not present

## 2018-02-24 DIAGNOSIS — C801 Malignant (primary) neoplasm, unspecified: Secondary | ICD-10-CM | POA: Diagnosis not present

## 2018-02-24 DIAGNOSIS — G959 Disease of spinal cord, unspecified: Secondary | ICD-10-CM | POA: Diagnosis not present

## 2018-02-24 DIAGNOSIS — Z91048 Other nonmedicinal substance allergy status: Secondary | ICD-10-CM | POA: Diagnosis not present

## 2018-02-24 DIAGNOSIS — C562 Malignant neoplasm of left ovary: Secondary | ICD-10-CM | POA: Diagnosis present

## 2018-02-24 DIAGNOSIS — R140 Abdominal distension (gaseous): Secondary | ICD-10-CM | POA: Diagnosis not present

## 2018-02-24 DIAGNOSIS — M532X4 Spinal instabilities, thoracic region: Secondary | ICD-10-CM | POA: Diagnosis not present

## 2018-02-24 DIAGNOSIS — R222 Localized swelling, mass and lump, trunk: Secondary | ICD-10-CM | POA: Diagnosis not present

## 2018-02-24 DIAGNOSIS — Z7951 Long term (current) use of inhaled steroids: Secondary | ICD-10-CM | POA: Diagnosis not present

## 2018-02-24 DIAGNOSIS — C569 Malignant neoplasm of unspecified ovary: Secondary | ICD-10-CM | POA: Diagnosis not present

## 2018-02-24 DIAGNOSIS — Z79890 Hormone replacement therapy (postmenopausal): Secondary | ICD-10-CM | POA: Diagnosis not present

## 2018-02-24 DIAGNOSIS — R4020 Unspecified coma: Secondary | ICD-10-CM | POA: Diagnosis not present

## 2018-02-24 DIAGNOSIS — K5909 Other constipation: Secondary | ICD-10-CM | POA: Diagnosis not present

## 2018-02-24 DIAGNOSIS — A419 Sepsis, unspecified organism: Secondary | ICD-10-CM | POA: Diagnosis not present

## 2018-02-24 DIAGNOSIS — G9529 Other cord compression: Secondary | ICD-10-CM | POA: Diagnosis not present

## 2018-02-24 DIAGNOSIS — G992 Myelopathy in diseases classified elsewhere: Secondary | ICD-10-CM | POA: Diagnosis present

## 2018-02-24 DIAGNOSIS — C7951 Secondary malignant neoplasm of bone: Secondary | ICD-10-CM | POA: Diagnosis not present

## 2018-02-24 DIAGNOSIS — C786 Secondary malignant neoplasm of retroperitoneum and peritoneum: Secondary | ICD-10-CM | POA: Diagnosis not present

## 2018-02-24 DIAGNOSIS — J449 Chronic obstructive pulmonary disease, unspecified: Secondary | ICD-10-CM | POA: Diagnosis present

## 2018-02-24 DIAGNOSIS — C7800 Secondary malignant neoplasm of unspecified lung: Secondary | ICD-10-CM | POA: Diagnosis present

## 2018-02-24 DIAGNOSIS — C7949 Secondary malignant neoplasm of other parts of nervous system: Secondary | ICD-10-CM | POA: Diagnosis not present

## 2018-02-24 DIAGNOSIS — M549 Dorsalgia, unspecified: Secondary | ICD-10-CM | POA: Diagnosis not present

## 2018-02-24 DIAGNOSIS — Z7189 Other specified counseling: Secondary | ICD-10-CM | POA: Diagnosis not present

## 2018-02-24 DIAGNOSIS — F329 Major depressive disorder, single episode, unspecified: Secondary | ICD-10-CM | POA: Diagnosis present

## 2018-02-24 DIAGNOSIS — I6782 Cerebral ischemia: Secondary | ICD-10-CM | POA: Diagnosis not present

## 2018-02-24 DIAGNOSIS — D497 Neoplasm of unspecified behavior of endocrine glands and other parts of nervous system: Secondary | ICD-10-CM | POA: Diagnosis not present

## 2018-02-24 MED ORDER — TRAMADOL HCL 50 MG PO TABS
50.0000 mg | ORAL_TABLET | Freq: Four times a day (QID) | ORAL | Status: DC | PRN
  Administered 2018-02-25 – 2018-03-06 (×8): 50 mg via ORAL
  Filled 2018-02-24 (×8): qty 1

## 2018-02-24 MED ORDER — GADOBENATE DIMEGLUMINE 529 MG/ML IV SOLN
15.0000 mL | Freq: Once | INTRAVENOUS | Status: AC | PRN
  Administered 2018-02-24: 14 mL via INTRAVENOUS

## 2018-02-24 MED ORDER — IOPAMIDOL (ISOVUE-300) INJECTION 61%: INTRAVENOUS | 0 refills | Status: DC

## 2018-02-24 MED ORDER — ACETAMINOPHEN 650 MG RE SUPP: 650.0000 mg | Freq: Four times a day (QID) | RECTAL | Status: DC | PRN

## 2018-02-24 MED ORDER — ENOXAPARIN SODIUM 40 MG/0.4ML ~~LOC~~ SOLN
40.0000 mg | Freq: Every day | SUBCUTANEOUS | Status: DC
  Administered 2018-02-24: 40 mg via SUBCUTANEOUS
  Filled 2018-02-24: qty 0.4

## 2018-02-24 MED ORDER — IOPAMIDOL (ISOVUE-300) INJECTION 61%
INTRAVENOUS | Status: AC
  Administered 2018-02-24: 15 mL via ORAL
  Filled 2018-02-24: qty 30

## 2018-02-24 MED ORDER — IOPAMIDOL (ISOVUE-300) INJECTION 61%
INTRAVENOUS | Status: AC
  Filled 2018-02-24: qty 100

## 2018-02-24 MED ORDER — ONDANSETRON HCL 4 MG PO TABS
4.0000 mg | ORAL_TABLET | Freq: Four times a day (QID) | ORAL | Status: DC | PRN
  Administered 2018-03-07: 4 mg via ORAL
  Filled 2018-02-24: qty 1

## 2018-02-24 MED ORDER — TRIAMTERENE-HCTZ 37.5-25 MG PO TABS
1.0000 | ORAL_TABLET | Freq: Every day | ORAL | Status: DC
Start: 2018-02-24 — End: 2018-03-08
  Administered 2018-02-25 – 2018-03-08 (×12): 1 via ORAL
  Filled 2018-02-24 (×13): qty 1

## 2018-02-24 MED ORDER — ACETAMINOPHEN 325 MG PO TABS
650.0000 mg | ORAL_TABLET | Freq: Four times a day (QID) | ORAL | Status: DC | PRN
  Administered 2018-02-25 – 2018-03-02 (×4): 650 mg via ORAL
  Filled 2018-02-24 (×4): qty 2

## 2018-02-24 MED ORDER — IOPAMIDOL (ISOVUE-300) INJECTION 61%
100.0000 mL | Freq: Once | INTRAVENOUS | Status: AC | PRN
  Administered 2018-02-24: 100 mL via INTRAVENOUS

## 2018-02-24 MED ORDER — ENSURE ENLIVE PO LIQD
237.0000 mL | Freq: Two times a day (BID) | ORAL | Status: DC
  Administered 2018-02-24 – 2018-03-06 (×16): 237 mL via ORAL

## 2018-02-24 MED ORDER — FENTANYL CITRATE (PF) 100 MCG/2ML IJ SOLN
50.0000 ug | INTRAMUSCULAR | Status: DC | PRN
  Administered 2018-02-24 (×5): 100 ug via INTRAVENOUS
  Administered 2018-02-25: 50 ug via INTRAVENOUS
  Administered 2018-02-25 (×2): 100 ug via INTRAVENOUS
  Administered 2018-02-26: 50 ug via INTRAVENOUS
  Administered 2018-02-26: 100 ug via INTRAVENOUS
  Administered 2018-02-26: 50 ug via INTRAVENOUS
  Administered 2018-02-26: 100 ug via INTRAVENOUS
  Administered 2018-02-26: 50 ug via INTRAVENOUS
  Administered 2018-02-26 – 2018-02-27 (×3): 100 ug via INTRAVENOUS
  Administered 2018-02-27 (×3): 50 ug via INTRAVENOUS
  Administered 2018-02-27: 100 ug via INTRAVENOUS
  Administered 2018-02-27: 50 ug via INTRAVENOUS
  Administered 2018-02-27 (×4): 25 ug via INTRAVENOUS
  Administered 2018-02-28 (×2): 100 ug via INTRAVENOUS
  Filled 2018-02-24 (×23): qty 2

## 2018-02-24 MED ORDER — ENOXAPARIN SODIUM 40 MG/0.4ML ~~LOC~~ SOLN
40.0000 mg | SUBCUTANEOUS | Status: DC
  Administered 2018-02-26: 40 mg via SUBCUTANEOUS
  Filled 2018-02-24: qty 0.4

## 2018-02-24 MED ORDER — ONDANSETRON HCL 4 MG/2ML IJ SOLN
4.0000 mg | Freq: Four times a day (QID) | INTRAMUSCULAR | Status: DC | PRN
Start: 2018-02-24 — End: 2018-03-08
  Administered 2018-02-24 – 2018-03-06 (×7): 4 mg via INTRAVENOUS
  Filled 2018-02-24 (×8): qty 2
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
## 2018-02-24 NOTE — Progress Notes (Signed)
ED TO INPATIENT HANDOFF REPORT  Name/Age/Gender Susan Bradford 84 y.o. female  Code Status    Code Status Orders  (From admission, onward)        Start     Ordered   02/24/18 0220  Full code  Continuous     02/24/18 0221    Code Status History    This patient has a current code status but no historical code status.    Advance Directive Documentation     Most Recent Value  Type of Advance Directive  Healthcare Power of Attorney  Pre-existing out of facility DNR order (yellow form or pink MOST form)  -  "MOST" Form in Place?  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 Cancer ([place])    ovarian   . High cholesterol   . Hypertension     Allergies Allergies  Allergen Reactions  . Hydromorphone Nausea And Vomiting  . Oxycodone Other (See Comments)    Patient states "I will never take oxycodone again." Patient states that it was too powerful.  . Tape Rash    **PAPER TAPE OK**    IV Location/Drains/Wounds Patient Lines/Drains/Airways Status   Active Line/Drains/Airways    None          Labs/Imaging Results for orders placed or performed during the hospital encounter of 02/16/2018 (from the past 48 hour(s))  Lipase, blood     Status: None   Collection Time: 02/08/2018  4:56 PM  Result  Value Ref Range   Lipase 24 11 - 51 U/L    Comment: Performed at [facility], [place] [address] [place], [place] [address]  Troponin I     Status: None   Collection Time: 02/18/2018  4:56 PM  Result Value Ref Range   Troponin I <0.03 <0.03 ng/mL    Comment: Performed at [facility], [place] [address] [place], [place] [address]  Comprehensive metabolic panel     Status: Abnormal   Collection Time: 02/06/2018  4:57 PM  Result Value Ref Range   Sodium 135 135 - 145 mmol/L   Potassium 3.8 3.5 - 5.1 mmol/L   Chloride 94 (L) 98 - 111 mmol/L   CO2 30 22 - 32 mmol/L   Glucose, Bld 132 (H) 70 - 99 mg/dL   BUN 21 8 - 23 mg/dL   Creatinine, Ser 0.70 0.44 - 1.00 mg/dL   Calcium 11.2 (H) 8.9 - 10.3 mg/dL   Total Protein 7.2 6.5 - 8.1 g/dL   Albumin 3.5 3.5 - 5.0 g/dL   AST 25 15 - 41 U/L   ALT 28 0 - 44 U/L   Alkaline Phosphatase 104 38 - 126 U/L   Total Bilirubin 0.9 0.3 - 1.2 mg/dL   GFR calc non Af Amer >60 >60  mL/min   GFR calc Af Amer >60 >60 mL/min    Comment: (NOTE) The eGFR has been calculated using the CKD EPI equation. This calculation has not been validated in all clinical situations. eGFR's persistently <60 mL/min signify possible Chronic Kidney Disease.    Anion gap 11 5 - 15    Comment: Performed at [facility], [place] [address], [place], [place] [address]  CBC with Differential     Status: Abnormal   Collection Time: 02/12/2018  4:57 PM  Result Value Ref Range   WBC 7.3 4.0 - 10.5 K/uL   RBC 4.48 3.87 - 5.11 MIL/uL   Hemoglobin 12.9 12.0 - 15.0 g/dL   HCT 39.4 36.0 - 46.0 %   MCV 87.9 78.0 - 100.0 fL   MCH 28.8 26.0 - 34.0 pg   MCHC 32.7 30.0 - 36.0 g/dL   RDW 13.4 11.5 - 15.5 %   Platelets 212 150 - 400 K/uL   Neutrophils Relative % 89 %   Neutro Abs 6.5 1.7 - 7.7 K/uL   Lymphocytes Relative 7 %   Lymphs Abs 0.5 (L) 0.7 - 4.0 K/uL   Monocytes Relative 4 %   Monocytes Absolute 0.3 0.1 - 1.0 K/uL   Eosinophils  Relative 0 %   Eosinophils Absolute 0.0 0.0 - 0.7 K/uL   Basophils Relative 0 %   Basophils Absolute 0.0 0.0 - 0.1 K/uL    Comment: Performed at [facility], [place] [address] [place], [place] [address]  Urinalysis, Routine w reflex microscopic     Status: Abnormal   Collection Time: 02/14/2018  6:18 PM  Result Value Ref Range   Color, Urine YELLOW YELLOW   APPearance CLEAR CLEAR   Specific Gravity, Urine 1.016 1.005 - 1.030   pH 5.0 5.0 - 8.0   Glucose, UA NEGATIVE NEGATIVE mg/dL   Hgb urine dipstick NEGATIVE NEGATIVE   Bilirubin Urine NEGATIVE NEGATIVE   Ketones, ur 5 (A) NEGATIVE mg/dL   Protein, ur NEGATIVE NEGATIVE mg/dL   Nitrite NEGATIVE NEGATIVE   Leukocytes, UA NEGATIVE NEGATIVE    Comment: Performed at [facility] [address], [place], [place] [address]   Dg Chest 2 View  Result Date: 02/07/2018 CLINICAL DATA:  mid-upper back pain that started Feb last year but got worse today. Pt went to Chiropractor last week and had her back re-aligned. Feels he may have been to hard and today she slept in later than her usual so dont know if that is what caused her back to hurt worse. Also c/o nausea. H/o ovarian cancer and HTN. Former smoker. EXAM: CHEST - 2 VIEW COMPARISON:  02/16/2018 FINDINGS: 3.2 cm bilobed nodular lesion projects in the left upper lung, previously 2.3 cm. On the lateral radiograph, there is suggestion these may represent two discrete lesions in anterior and posterior segments. There may be a 9 mm nodule in the left lower lung as well. Right lung clear. Heart size upper limits normal. Aortic Atherosclerosis (ICD10-170.0). No effusion. Degenerative change in the right shoulder. IMPRESSION: 1. Left lung nodules, enlarging, new since 12/19/2015. Consider CT chest with contrast when the patient is stable for further characterization. Electronically Signed   By: Lucrezia Europe M.D.   On: 02/02/2018 18:14   Dg Thoracic Spine  W/swimmers  Result Date: 02/26/2018 CLINICAL DATA:  mid-upper back pain that started Feb last year but got worse today. Pt went to Chiropractor last week and had her back re-aligned. Feels he may  have been to hard and today she slept in later than her usual so dont know if that is what caused her back to hurt worse. H/o ovarian cancer and HTN. Former smoker. EXAM: THORACIC SPINE - 3 VIEWS COMPARISON:  02/16/2018 and earlier studies FINDINGS: Mild thoracic dextroscoliosis without evident underlying vertebral anomaly. Not all interspaces are well profiled on the lateral projections. No definite fracture. Anterior vertebral endplate spurring at multiple levels in the mid and lower thoracic spine. Aortic Atherosclerosis (ICD10-170.0). 2.2 cm nodular density projects in the left upper lung, not conspicuous on prior study of 12/19/2015. IMPRESSION: 1. Thoracic dextroscoliosis with multilevel endplate spurring, no definite acute abnormality. 2. Left upper lobe lung lesion. Electronically Signed   By: Lucrezia Europe M.D.   On: 02/25/2018 18:10   Ct Chest W Contrast  Result Date: 02/25/2018 CLINICAL DATA:  Mid and upper back pain since February 2018, worsening today. Soft higher fracture last week. History of ovarian cancer. EXAM: CT CHEST WITH CONTRAST TECHNIQUE: Multidetector CT imaging of the chest was performed during intravenous contrast administration. CONTRAST:  18m ISOVUE-300 IOPAMIDOL (ISOVUE-300) INJECTION 61% COMPARISON:  Chest radiograph February 23, 2018 FINDINGS: CARDIOVASCULAR: Heart size mildly enlarged. No pericardial effusion. Mild coronary artery calcification. Ascending aorta is 4.1 cm. Mild calcific atherosclerosis. MEDIASTINUM/NODES: 17 mm anterior mediastinal lymph node versus pulmonary nodule. No hilar, axillary or middle mediastinal lymphadenopathy. LUNGS/PLEURA: Tracheobronchial tree is patent, no pneumothorax. Moderate centrilobular emphysema. RIGHT upper lobe calcified granuloma. Measuring to 1.6 x  2.7 cm LEFT upper lobe. UPPER ABDOMEN: Nonacute.  Normal included adrenal glands. MUSCULOSKELETAL: Soft tissue mass LEFT T6 pedicle extending to the transverse process, laminar and spinous process. Severe canal stenosis consistent with epidural extension, narrowed to approximately 3 mm in transverse dimension (series 2, image 40). Mild-to-moderate T6 pathologic fracture with approximately 30-50% height loss. Severe degenerative change RIGHT glenohumeral joint space. Old sternal fracture. Osteopenia. IMPRESSION: 1. Multiple pulmonary nodules measuring to 2.7 cm highly concerning for metastatic disease. Given low-density nodules, this could reflect mucinous metastasis. 2. Expansile mass LEFT T6 vertebral body consistent with tumor resulting in severe canal stenosis and cord compression. Mild to moderate T6 pathologic fracture. Recommend MRI of the thoracic spine with contrast and neuro surgical consultation. 3. 17 mm anterior mediastinal lymph node versus metastasis. 4. **An incidental finding of potential clinical significance has been found. 4.1 cm aneurysmal ascending aorta. Recommend annual imaging followup by CTA or MRA. This recommendation follows 2010 ACCF/AHA/AATS/ACR/ASA/SCA/SCAI/SIR/STS/SVM Guidelines for the Diagnosis and Management of Patients with Thoracic Aortic Disease. Circulation. 2010; 121: [id number]* 5. Acute findings discussed with and reconfirmed by PA.ABIGAIL HARRIS on 02/09/2018 at 9:39 pm. Aortic Atherosclerosis (ICD10-I70.0) and Emphysema (ICD10-J43.9). Electronically Signed   By: CElon AlasM.D.   On: 02/24/2018 21:40    Pending Labs Unresulted Labs (From admission, onward)   None      Vitals/Pain Today's Vitals   02/24/18 0005 02/24/18 0100 02/24/18 0200 02/24/18 0300  BP:  (!) 154/73 (!) 156/90 (!) 151/128  Pulse:  65 78 68  Resp:  '16 20 18  '$ Temp:      TempSrc:      SpO2:  94% 95% 94%  PainSc: 5        Isolation Precautions No active  isolations  Medications Medications  atorvastatin (LIPITOR) tablet 80 mg (has no administration in time range)  levothyroxine (SYNTHROID, LEVOTHROID) tablet 50 mcg (has no administration in time range)  LORazepam (ATIVAN) tablet 1 mg (1 mg Oral Given 02/24/18 0006)  sertraline (ZOLOFT)  tablet 50 mg (has no administration in time range)  triamterene-hydrochlorothiazide (DYAZIDE) 37.5-25 MG per capsule 1 capsule (has no administration in time range)  mometasone-formoterol (DULERA) 200-5 MCG/ACT inhaler 2 puff (2 puffs Inhalation Not Given 02/18/2018 2352)  acetaminophen (TYLENOL) tablet 650 mg (has no administration in time range)    Or  acetaminophen (TYLENOL) suppository 650 mg (has no administration in time range)  ondansetron (ZOFRAN) tablet 4 mg (has no administration in time range)    Or  ondansetron (ZOFRAN) injection 4 mg (has no administration in time range)  enoxaparin (LOVENOX) injection 40 mg (has no administration in time range)  fentaNYL (SUBLIMAZE) injection 50-100 mcg (has no administration in time range)  fentaNYL (SUBLIMAZE) injection 50 mcg (50 mcg Intravenous Given 02/17/2018 1713)  ondansetron (ZOFRAN) injection 4 mg (4 mg Intravenous Given 02/12/2018 1713)  iopamidol (ISOVUE-300) 61 % injection 75 mL (75 mLs Intravenous Contrast Given 02/08/2018 2058)    Mobility walks

## 2018-02-24 NOTE — Progress Notes (Signed)
I spoke with the patient briefly to discuss that we would anticipate seeing her following her surgery. I'll ask our schedulers to get her on Dr. Ida Rogue schedule for formal consultation about 2 weeks after her procedure. She is in agreement.

## 2018-02-24 NOTE — Progress Notes (Signed)
Patient ID: Susan Bradford, female   DOB: 05-10-1934, 84 y.o.   MRN: [id number] BP (!) 147/72 (BP Location: Right Arm)   Pulse 61   Temp 99.3 F (37.4 C) (Oral)   Resp 14   Ht 5\' 5"  (1.651 m)   Wt 63.6 kg (140 lb 3.4 oz)   SpO2 96%   BMI 23.33 kg/m  Mri reviewed, it does show mild, cord compression without signal change. Given normal exam will hold decadron. Plan on surgery this Friday to stabilize the spine possible tissue diagnosis, if no other site accessible.
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
## 2018-02-24 NOTE — Consult Note (Addendum)
Reason for Consult:Metastatic spinal thoracic tumor Referring Physician: Naturi Bradford is an 84 y.o. female.  HPI: whom came to the ED today for evaluation of severe back pain, which has gotten worse recently. It does not radiate, she has no pain in the lower extremities. She had seen her Primary care physician, who did order xrays of the spine. Pain has been ongoing for multiple months. Received chiropractic care without relief. The pain is located on the left side of her back in the thoracic region.CT revealed a lytic lesion with canal compromise, possible cord compression.  She has no hx of bowel and or bladder difficulties. Bladder scan tonight showed minimal residuals. Past Medical History:  Diagnosis Date  . Cancer ([place])    ovarian   . High cholesterol   . Hypertension     Past Surgical History:  Procedure Laterality Date  . ABDOMINAL HYSTERECTOMY    . ABDOMINAL SURGERY    . BACK SURGERY    . CESAREAN SECTION      No family history on file.  Social History:  reports that she quit smoking about 17 years ago. Her smoking use included cigarettes. She has a 50.00 pack-year smoking history. She has never used smokeless tobacco. She reports that she drinks alcohol. She reports that she does not use drugs.  Allergies:  Allergies  Allergen Reactions  . Hydromorphone Nausea And Vomiting  . Oxycodone Other (See Comments)    Patient states "I will never take oxycodone again." Patient states that it was too powerful.  . Tape Rash    **PAPER TAPE OK**    Medications: I have reviewed the patient's current medications.  Results for orders placed or performed during the hospital encounter of 02/10/2018 (from the past 48 hour(s))  Lipase, blood     Status: None   Collection Time: 02/16/2018  4:56 PM  Result Value Ref Range   Lipase 24 11 - 51 U/L    Comment: Performed at [facility], [place] [address], [place], [place] [address]  Troponin I     Status: None    Collection Time: 03/02/2018  4:56 PM  Result Value Ref Range   Troponin I <0.03 <0.03 ng/mL    Comment: Performed at [facility], [place] [address] [place], [place] [address]  Comprehensive metabolic panel     Status: Abnormal   Collection Time: 02/12/2018  4:57 PM  Result Value Ref Range   Sodium 135 135 - 145 mmol/L   Potassium 3.8 3.5 - 5.1 mmol/L   Chloride 94 (L) 98 - 111 mmol/L   CO2 30 22 - 32 mmol/L   Glucose, Bld 132 (H) 70 - 99 mg/dL   BUN 21 8 - 23 mg/dL   Creatinine, Ser 0.70 0.44 - 1.00 mg/dL   Calcium 11.2 (H) 8.9 - 10.3 mg/dL   Total Protein 7.2 6.5 - 8.1 g/dL   Albumin 3.5 3.5 - 5.0 g/dL   AST 25 15 - 41 U/L   ALT 28 0 - 44 U/L   Alkaline Phosphatase 104 38 - 126 U/L   Total Bilirubin 0.9 0.3 - 1.2 mg/dL   GFR calc non Af Amer >60 >60 mL/min   GFR calc Af Amer >60 >60 mL/min    Comment: (NOTE) The eGFR has been calculated using the CKD EPI equation. This calculation has not been validated in all clinical situations. eGFR's persistently <60 mL/min signify possible Chronic Kidney Disease.    Anion gap 11  5 - 15    Comment: Performed at [facility], [place] [address], [place], [place] [address]  CBC with Differential     Status: Abnormal   Collection Time: 02/26/2018  4:57 PM  Result Value Ref Range   WBC 7.3 4.0 - 10.5 K/uL   RBC 4.48 3.87 - 5.11 MIL/uL   Hemoglobin 12.9 12.0 - 15.0 g/dL   HCT 39.4 36.0 - 46.0 %   MCV 87.9 78.0 - 100.0 fL   MCH 28.8 26.0 - 34.0 pg   MCHC 32.7 30.0 - 36.0 g/dL   RDW 13.4 11.5 - 15.5 %   Platelets 212 150 - 400 K/uL   Neutrophils Relative % 89 %   Neutro Abs 6.5 1.7 - 7.7 K/uL   Lymphocytes Relative 7 %   Lymphs Abs 0.5 (L) 0.7 - 4.0 K/uL   Monocytes Relative 4 %   Monocytes Absolute 0.3 0.1 - 1.0 K/uL   Eosinophils Relative 0 %   Eosinophils Absolute 0.0 0.0 - 0.7 K/uL   Basophils Relative 0 %   Basophils Absolute 0.0 0.0 - 0.1 K/uL    Comment: Performed at [facility],  [place] [address] [place], [place] [address]  Urinalysis, Routine w reflex microscopic     Status: Abnormal   Collection Time: 02/07/2018  6:18 PM  Result Value Ref Range   Color, Urine YELLOW YELLOW   APPearance CLEAR CLEAR   Specific Gravity, Urine 1.016 1.005 - 1.030   pH 5.0 5.0 - 8.0   Glucose, UA NEGATIVE NEGATIVE mg/dL   Hgb urine dipstick NEGATIVE NEGATIVE   Bilirubin Urine NEGATIVE NEGATIVE   Ketones, ur 5 (A) NEGATIVE mg/dL   Protein, ur NEGATIVE NEGATIVE mg/dL   Nitrite NEGATIVE NEGATIVE   Leukocytes, UA NEGATIVE NEGATIVE    Comment: Performed at [facility] [address], [place], [place] [address]    Dg Chest 2 View  Result Date: 03/02/2018 CLINICAL DATA:  mid-upper back pain that started Feb last year but got worse today. Pt went to Chiropractor last week and had her back re-aligned. Feels he may have been to hard and today she slept in later than her usual so dont know if that is what caused her back to hurt worse. Also c/o nausea. H/o ovarian cancer and HTN. Former smoker. EXAM: CHEST - 2 VIEW COMPARISON:  02/16/2018 FINDINGS: 3.2 cm bilobed nodular lesion projects in the left upper lung, previously 2.3 cm. On the lateral radiograph, there is suggestion these may represent two discrete lesions in anterior and posterior segments. There may be a 9 mm nodule in the left lower lung as well. Right lung clear. Heart size upper limits normal. Aortic Atherosclerosis (ICD10-170.0). No effusion. Degenerative change in the right shoulder. IMPRESSION: 1. Left lung nodules, enlarging, new since 12/19/2015. Consider CT chest with contrast when the patient is stable for further characterization. Electronically Signed   By: Lucrezia Europe M.D.   On: 02/19/2018 18:14   Dg Thoracic Spine W/swimmers  Result Date: 02/15/2018 CLINICAL DATA:  mid-upper back pain that started Feb last year but got worse today. Pt went to Chiropractor last week and had her back re-aligned. Feels he may  have been to hard and today she slept in later than her usual so dont know if that is what caused her back to hurt worse. H/o ovarian cancer and HTN. Former smoker. EXAM: THORACIC SPINE - 3 VIEWS COMPARISON:  02/16/2018 and earlier studies FINDINGS: Mild thoracic dextroscoliosis  without evident underlying vertebral anomaly. Not all interspaces are well profiled on the lateral projections. No definite fracture. Anterior vertebral endplate spurring at multiple levels in the mid and lower thoracic spine. Aortic Atherosclerosis (ICD10-170.0). 2.2 cm nodular density projects in the left upper lung, not conspicuous on prior study of 12/19/2015. IMPRESSION: 1. Thoracic dextroscoliosis with multilevel endplate spurring, no definite acute abnormality. 2. Left upper lobe lung lesion. Electronically Signed   By: Lucrezia Europe M.D.   On: 02/13/2018 18:10   Ct Chest W Contrast  Result Date: 02/11/2018 CLINICAL DATA:  Mid and upper back pain since February 2018, worsening today. Soft higher fracture last week. History of ovarian cancer. EXAM: CT CHEST WITH CONTRAST TECHNIQUE: Multidetector CT imaging of the chest was performed during intravenous contrast administration. CONTRAST:  51m ISOVUE-300 IOPAMIDOL (ISOVUE-300) INJECTION 61% COMPARISON:  Chest radiograph February 23, 2018 FINDINGS: CARDIOVASCULAR: Heart size mildly enlarged. No pericardial effusion. Mild coronary artery calcification. Ascending aorta is 4.1 cm. Mild calcific atherosclerosis. MEDIASTINUM/NODES: 17 mm anterior mediastinal lymph node versus pulmonary nodule. No hilar, axillary or middle mediastinal lymphadenopathy. LUNGS/PLEURA: Tracheobronchial tree is patent, no pneumothorax. Moderate centrilobular emphysema. RIGHT upper lobe calcified granuloma. Measuring to 1.6 x 2.7 cm LEFT upper lobe. UPPER ABDOMEN: Nonacute.  Normal included adrenal glands. MUSCULOSKELETAL: Soft tissue mass LEFT T6 pedicle extending to the transverse process, laminar and spinous process.  Severe canal stenosis consistent with epidural extension, narrowed to approximately 3 mm in transverse dimension (series 2, image 40). Mild-to-moderate T6 pathologic fracture with approximately 30-50% height loss. Severe degenerative change RIGHT glenohumeral joint space. Old sternal fracture. Osteopenia. IMPRESSION: 1. Multiple pulmonary nodules measuring to 2.7 cm highly concerning for metastatic disease. Given low-density nodules, this could reflect mucinous metastasis. 2. Expansile mass LEFT T6 vertebral body consistent with tumor resulting in severe canal stenosis and cord compression. Mild to moderate T6 pathologic fracture. Recommend MRI of the thoracic spine with contrast and neuro surgical consultation. 3. 17 mm anterior mediastinal lymph node versus metastasis. 4. **An incidental finding of potential clinical significance has been found. 4.1 cm aneurysmal ascending aorta. Recommend annual imaging followup by CTA or MRA. This recommendation follows 2010 ACCF/AHA/AATS/ACR/ASA/SCA/SCAI/SIR/STS/SVM Guidelines for the Diagnosis and Management of Patients with Thoracic Aortic Disease. Circulation. 2010; 121: [id number]* 5. Acute findings discussed with and reconfirmed by PA.ABIGAIL HARRIS on 02/08/2018 at 9:39 pm. Aortic Atherosclerosis (ICD10-I70.0) and Emphysema (ICD10-J43.9). Electronically Signed   By: CElon AlasM.D.   On: 02/26/2018 21:40    Review of Systems  Constitutional: Negative.   HENT: Negative.   Eyes: Negative.   Respiratory: Negative.   Cardiovascular: Negative.   Gastrointestinal: Negative.   Genitourinary: Negative.   Musculoskeletal: Positive for back pain.  Skin: Negative.   Neurological: Negative.   Endo/Heme/Allergies: Negative.   Psychiatric/Behavioral: Negative.    Blood pressure (!) 154/73, pulse 65, temperature 97.6 F (36.4 C), temperature source Oral, resp. rate 16, SpO2 94 %. Physical Exam  Constitutional: She is oriented to person, place, and time. She  appears well-developed and well-nourished. She appears distressed.  HENT:  Head: Normocephalic and atraumatic.  Eyes: Pupils are equal, round, and reactive to light. Conjunctivae and EOM are normal.  Neck: Normal range of motion. Neck supple.  Cardiovascular: Normal rate, regular rhythm and normal heart sounds.  Respiratory: Effort normal and breath sounds normal.  GI: Soft. Bowel sounds are normal.  Neurological: She is alert and oriented to person, place, and time. She has normal strength and normal reflexes. She displays normal reflexes. No  cranial nerve deficit. Coordination normal.  Skin: Skin is dry.  Psychiatric: She has a normal mood and affect. Her behavior is normal. Judgment and thought content normal.    Assessment/Plan: Will await MRI T spine. Given her neurological exam which is indeed normal, I am slightly skeptical that the cord compression is as bad as the CT implies. But the MRI will tell the whole truth. She stated simply that the pain is too much for her to bear.  Would not start decadron at this time as she is neurologically doing well.   Riley Papin L 02/24/2018, 2:05 AM

## 2018-02-25 ENCOUNTER — Inpatient Hospital Stay (HOSPITAL_COMMUNITY): Payer: Medicare Other

## 2018-02-25 ENCOUNTER — Encounter (HOSPITAL_COMMUNITY): Payer: Self-pay | Admitting: Physician Assistant

## 2018-02-25 LAB — CA 125: Cancer Antigen (CA) 125: 11 U/mL (ref 0.0–38.1)

## 2018-02-25 LAB — BASIC METABOLIC PANEL
ANION GAP: 14 (ref 5–15)
BUN: 14 mg/dL (ref 8–23)
CHLORIDE: 93 mmol/L — AB (ref 98–111)
CO2: 29 mmol/L (ref 22–32)
Calcium: 11.1 mg/dL — ABNORMAL HIGH (ref 8.9–10.3)
Creatinine, Ser: 0.59 mg/dL (ref 0.44–1.00)
GFR calc Af Amer: >60 mL/min (ref >60)
GLUCOSE: 110 mg/dL — AB (ref 70–99)
Potassium: 3.6 mmol/L (ref 3.5–5.1)
Sodium: 136 mmol/L (ref 135–145)

## 2018-02-25 LAB — PROTIME-INR
INR: 1.02
Prothrombin Time: 13.3 seconds (ref 11.4–15.2)

## 2018-02-25 LAB — CBC
HCT: 37.8 % (ref 36.0–46.0)
HEMOGLOBIN: 12.6 g/dL (ref 12.0–15.0)
MCH: 29.4 pg (ref 26.0–34.0)
MCHC: 33.3 g/dL (ref 30.0–36.0)
MCV: 88.1 fL (ref 78.0–100.0)
PLATELETS: 254 10*3/uL (ref 150–400)
RBC: 4.29 MIL/uL (ref 3.87–5.11)
RDW: 13.5 % (ref 11.5–15.5)
WBC: 6.2 10*3/uL (ref 4.0–10.5)

## 2018-02-25 MED ORDER — FLUMAZENIL 0.5 MG/5ML IV SOLN
INTRAVENOUS | Status: AC
  Filled 2018-02-25: qty 5

## 2018-02-25 MED ORDER — MIDAZOLAM HCL 2 MG/2ML IJ SOLN
INTRAMUSCULAR | Status: AC
Start: 2018-02-25 — End: 2018-02-26
  Filled 2018-02-25: qty 4

## 2018-02-25 MED ORDER — NALOXONE HCL 0.4 MG/ML IJ SOLN
INTRAMUSCULAR | Status: AC
  Filled 2018-02-25: qty 1

## 2018-02-25 MED ORDER — MIDAZOLAM HCL 2 MG/2ML IJ SOLN
INTRAMUSCULAR | Status: AC | PRN
  Administered 2018-02-25 (×2): 1 mg via INTRAVENOUS

## 2018-02-25 MED ORDER — FENTANYL CITRATE (PF) 100 MCG/2ML IJ SOLN
INTRAMUSCULAR | Status: AC | PRN
  Administered 2018-02-25: 50 ug via INTRAVENOUS

## 2018-02-25 MED ORDER — FENTANYL CITRATE (PF) 100 MCG/2ML IJ SOLN
INTRAMUSCULAR | Status: AC
  Filled 2018-02-25: qty 4

## 2018-02-25 NOTE — Progress Notes (Signed)
Initial Nutrition Assessment  DOCUMENTATION CODES:   Not applicable  INTERVENTION:    Monitor for diet advancement/toleration  Ensure Enlive po BID, each supplement provides 350 kcal and 20 grams of protein  NUTRITION DIAGNOSIS:   Increased nutrient needs related to cancer and cancer related treatments as evidenced by estimated needs.  GOAL:   Patient will meet greater than or equal to 90% of their needs  MONITOR:   PO intake, Supplement acceptance, Weight trends, Labs, Diet advancement  REASON FOR ASSESSMENT:   Malnutrition Screening Tool    ASSESSMENT:   Patient with PMH significant for HTN Stage IC granulosa cell tumor (diagnosed 11/2016) s/p exploratory laparotomy, radical resection of pelvic tumor with bilateral salpingo-oophorectomy, partial omentectomy, lymphadenectomy, cystoscopy on 11/05/16. Presents this admission with met to T6 causing apparent cord compression on CT scan.    Pt reports having a great appetite until 4 days ago where intake dropped to bites. She typically eats three meals each day that consist of good protein sources like chicken, eggs, and pork. She drinks Ensure off and on at home. Yesterday pt struggled with nausea from pain medication but it has resolved with medication today. She is currently NPO for biopsy. She would like to be sent Ensure once diet is advanced.   Pt endorses a UBW of 143 lb and a recent wt loss of 3 lbs. Records indicate pt weighed 148 lb on 06/11/17 and 140 lb this admission (insignificant wt loss of time frame). Nutrition-Focused physical exam completed.   Medications reviewed.  Labs reviewed: calcium 11.4 (H)  NUTRITION - FOCUSED PHYSICAL EXAM:    Most Recent Value  Orbital Region  No depletion  Upper Arm Region  No depletion  Thoracic and Lumbar Region  Unable to assess  Buccal Region  No depletion  Temple Region  Mild depletion  Clavicle Bone Region  No depletion  Clavicle and Acromion Bone Region  No depletion   Scapular Bone Region  Unable to assess  Dorsal Hand  No depletion  Patellar Region  No depletion  Anterior Thigh Region  No depletion  Posterior Calf Region  No depletion  Edema (RD Assessment)  None  Hair  Reviewed  Eyes  Reviewed  Mouth  Reviewed  Skin  Reviewed  Nails  Reviewed     Diet Order:   Diet Order           Diet NPO time specified  Diet effective now          EDUCATION NEEDS:   Education needs have been addressed  Skin:  Skin Assessment: Reviewed RN Assessment  Last BM:  PTA  Height:   Ht Readings from Last 1 Encounters:  02/24/18 _0  (1.651 m)    Weight:   Wt Readings from Last 1 Encounters:  02/24/18 140 lb 3.4 oz (63.6 kg)    Ideal Body Weight:  56.8 kg  BMI:  Body mass index is 23.33 kg/m.  Estimated Nutritional Needs:   Kcal:  1600-1800 kcal  Protein:  80-90 grams   Fluid:  >1.6 L/day    Mariana Single RD, LDN Clinical Nutrition Pager # - [phone]

## 2018-02-25 NOTE — Progress Notes (Signed)
PROGRESS NOTE    Susan Bradford  JAS:[id number] DOB: [birth year] DOA: 03/03/2018 PCP: Merrilee Seashore, MD    Brief Narrative:  84 year old with past medical history relevant for hypertension, hyperlipidemia, hypothyroidism, depression/anxiety, asthma/COPD, ovarian cancer status post resection in April 2018 who was admitted on 02/22/2018 for severe back pain and found to have widely metastatic disease (likely ovarian cancer) and T6 tumor cord compression.   Assessment & Plan:   Principal Problem:   Spinal cord compression due to malignant neoplasm metastatic to spine [facility]) Active Problems:   Ovarian cancer, unspecified laterality ([place])   Metastatic cancer to spine ([place])   Back pain   #) T6 cord compression: Due to malignant metastases.  Neurosurgery has been consulted and the patient is pending OR. -Likely OR to: On 02/14/2018 by neurosurgery -No steroids per neurosurgery  #) Widely metastatic cancer: Likely ovarian -Biopsy today on 02/25/2018 of abdominal/pelvis mass  #) Hypertension/hyperlipidemia: -Continue triamterene-HCTZ 37.5-25 mg -Continue atorvastatin 80 mg daily  #) hypothyroidism : - Continue levothyroxine 50 mg daily  #) Asthma/COPD: -Continue ICS/LABA  #) Pain/psych: -Continue sertraline 50 mg daily -Continue PRN lorazepam  Fluids: Tolerating p.o. Letter lites: Monitor and supplement exam nutrition: Regular diet  Prophylaxis: Enoxaparin  Disposition: Pending transfer to [place] for OR  Full code   Consultants:   Neurosurgery  Oncology  Procedures:   Biopsy on 02/25/2018 by IR for abdominal/pelvic mass  Antimicrobials:   None   Subjective: Patient reports she is doing fairly well.  She is somewhat confused as quite a few things been happening but she denies any nausea, vomiting, diarrhea.  She does not have much in the way of lower extremity weakness she reports.  Objective: Vitals:   02/25/18 1325 02/25/18 1330 02/25/18 1333 02/25/18  1401  BP: 135/63 116/62 124/72 (!) 114/96  Pulse: 62 62 64 65  Resp: 15 17 17 18   Temp:    98.8 F (37.1 C)  TempSrc:    Oral  SpO2: 98% 99% 99% 97%  Weight:      Height:        Intake/Output Summary (Last 24 hours) at 02/25/2018 1407 Last data filed at 02/24/2018 2206 Gross per 24 hour  Intake -  Output 800 ml  Net -800 ml   Filed Weights   02/24/18 1113  Weight: 63.6 kg (140 lb 3.4 oz)    Examination:  General exam: Appears calm and comfortable  Respiratory system: Clear to auscultation. Respiratory effort normal. Cardiovascular system: Regular rate and rhythm, no murmurs Gastrointestinal system: Abdomen is nondistended, soft and nontender. No organomegaly or masses felt. Normal bowel sounds heard. Central nervous system: Alert and oriented.  Bilateral extensors and flexors and lower extremity is 4 out of 5 strength, diminished sensation in lower extremity. Extremities: No lower extremity edema Skin: No rashes on visible skin Psychiatry: Judgement and insight appear normal. Mood & affect appropriate.     Data Reviewed: I have personally reviewed following labs and imaging studies  CBC: Recent Labs  Lab 02/11/2018 1657 02/25/18 0354  WBC 7.3 6.2  NEUTROABS 6.5  --   HGB 12.9 12.6  HCT 39.4 37.8  MCV 87.9 88.1  PLT 212 419   Basic Metabolic Panel: Recent Labs  Lab 02/24/2018 1657 02/25/18 0354  NA 135 136  K 3.8 3.6  CL 94* 93*  CO2 30 29  GLUCOSE 132* 110*  BUN 21 14  CREATININE 0.70 0.59  CALCIUM 11.2* 11.1*   GFR: Estimated Creatinine Clearance: 49.6 mL/min (by  C-G formula based on SCr of 0.59 mg/dL). Liver Function Tests: Recent Labs  Lab 03/02/2018 1657  AST 25  ALT 28  ALKPHOS 104  BILITOT 0.9  PROT 7.2  ALBUMIN 3.5   Recent Labs  Lab 02/10/2018 1656  LIPASE 24   No results for input(s): AMMONIA in the last 168 hours. Coagulation Profile: Recent Labs  Lab 02/25/18 0919  INR 1.02   Cardiac Enzymes: Recent Labs  Lab 02/22/2018 1656    TROPONINI <0.03   BNP (last 3 results) No results for input(s): PROBNP in the last 8760 hours. HbA1C: No results for input(s): HGBA1C in the last 72 hours. CBG: No results for input(s): GLUCAP in the last 168 hours. Lipid Profile: No results for input(s): CHOL, HDL, LDLCALC, TRIG, CHOLHDL, LDLDIRECT in the last 72 hours. Thyroid Function Tests: No results for input(s): TSH, T4TOTAL, FREET4, T3FREE, THYROIDAB in the last 72 hours. Anemia Panel: No results for input(s): VITAMINB12, FOLATE, FERRITIN, TIBC, IRON, RETICCTPCT in the last 72 hours. Sepsis Labs: No results for input(s): PROCALCITON, LATICACIDVEN in the last 168 hours.  No results found for this or any previous visit (from the past 240 hour(s)).       Radiology Studies: Dg Chest 2 View  Result Date: 02/12/2018 CLINICAL DATA:  mid-upper back pain that started Feb last year but got worse today. Pt went to Chiropractor last week and had her back re-aligned. Feels he may have been to hard and today she slept in later than her usual so dont know if that is what caused her back to hurt worse. Also c/o nausea. H/o ovarian cancer and HTN. Former smoker. EXAM: CHEST - 2 VIEW COMPARISON:  02/16/2018 FINDINGS: 3.2 cm bilobed nodular lesion projects in the left upper lung, previously 2.3 cm. On the lateral radiograph, there is suggestion these may represent two discrete lesions in anterior and posterior segments. There may be a 9 mm nodule in the left lower lung as well. Right lung clear. Heart size upper limits normal. Aortic Atherosclerosis (ICD10-170.0). No effusion. Degenerative change in the right shoulder. IMPRESSION: 1. Left lung nodules, enlarging, new since 12/19/2015. Consider CT chest with contrast when the patient is stable for further characterization. Electronically Signed   By: Lucrezia Europe M.D.   On: 02/19/2018 18:14   Dg Thoracic Spine W/swimmers  Result Date: 02/19/2018 CLINICAL DATA:  mid-upper back pain that started Feb  last year but got worse today. Pt went to Chiropractor last week and had her back re-aligned. Feels he may have been to hard and today she slept in later than her usual so dont know if that is what caused her back to hurt worse. H/o ovarian cancer and HTN. Former smoker. EXAM: THORACIC SPINE - 3 VIEWS COMPARISON:  02/16/2018 and earlier studies FINDINGS: Mild thoracic dextroscoliosis without evident underlying vertebral anomaly. Not all interspaces are well profiled on the lateral projections. No definite fracture. Anterior vertebral endplate spurring at multiple levels in the mid and lower thoracic spine. Aortic Atherosclerosis (ICD10-170.0). 2.2 cm nodular density projects in the left upper lung, not conspicuous on prior study of 12/19/2015. IMPRESSION: 1. Thoracic dextroscoliosis with multilevel endplate spurring, no definite acute abnormality. 2. Left upper lobe lung lesion. Electronically Signed   By: Lucrezia Europe M.D.   On: 02/18/2018 18:10   Ct Chest W Contrast  Result Date: 02/26/2018 CLINICAL DATA:  Mid and upper back pain since February 2018, worsening today. Soft higher fracture last week. History of ovarian cancer. EXAM: CT CHEST  WITH CONTRAST TECHNIQUE: Multidetector CT imaging of the chest was performed during intravenous contrast administration. CONTRAST:  19mL ISOVUE-300 IOPAMIDOL (ISOVUE-300) INJECTION 61% COMPARISON:  Chest radiograph February 23, 2018 FINDINGS: CARDIOVASCULAR: Heart size mildly enlarged. No pericardial effusion. Mild coronary artery calcification. Ascending aorta is 4.1 cm. Mild calcific atherosclerosis. MEDIASTINUM/NODES: 17 mm anterior mediastinal lymph node versus pulmonary nodule. No hilar, axillary or middle mediastinal lymphadenopathy. LUNGS/PLEURA: Tracheobronchial tree is patent, no pneumothorax. Moderate centrilobular emphysema. RIGHT upper lobe calcified granuloma. Measuring to 1.6 x 2.7 cm LEFT upper lobe. UPPER ABDOMEN: Nonacute.  Normal included adrenal glands.  MUSCULOSKELETAL: Soft tissue mass LEFT T6 pedicle extending to the transverse process, laminar and spinous process. Severe canal stenosis consistent with epidural extension, narrowed to approximately 3 mm in transverse dimension (series 2, image 40). Mild-to-moderate T6 pathologic fracture with approximately 30-50% height loss. Severe degenerative change RIGHT glenohumeral joint space. Old sternal fracture. Osteopenia. IMPRESSION: 1. Multiple pulmonary nodules measuring to 2.7 cm highly concerning for metastatic disease. Given low-density nodules, this could reflect mucinous metastasis. 2. Expansile mass LEFT T6 vertebral body consistent with tumor resulting in severe canal stenosis and cord compression. Mild to moderate T6 pathologic fracture. Recommend MRI of the thoracic spine with contrast and neuro surgical consultation. 3. 17 mm anterior mediastinal lymph node versus metastasis. 4. **An incidental finding of potential clinical significance has been found. 4.1 cm aneurysmal ascending aorta. Recommend annual imaging followup by CTA or MRA. This recommendation follows 2010 ACCF/AHA/AATS/ACR/ASA/SCA/SCAI/SIR/STS/SVM Guidelines for the Diagnosis and Management of Patients with Thoracic Aortic Disease. Circulation. 2010; 121: N235-T732** 5. Acute findings discussed with and reconfirmed by PA.ABIGAIL HARRIS on 03/01/2018 at 9:39 pm. Aortic Atherosclerosis (ICD10-I70.0) and Emphysema (ICD10-J43.9). Electronically Signed   By: Elon Alas M.D.   On: 03/04/2018 21:40   Mr Thoracic Spine W Wo Contrast  Addendum Date: 02/24/2018   ADDENDUM REPORT: 02/24/2018 09:26 ADDENDUM: Critical Value/emergent results were called by telephone at the time of interpretation on 02/24/2018 at 0914 hours to Dr. Zigmund Daniel, who verbally acknowledged these results. Electronically Signed   By: Genevie Ann M.D.   On: 02/24/2018 09:26   Result Date: 02/24/2018 CLINICAL DATA:  84 year old female status post treatment in 2018 of ovarian  tumor. Abnormal chest CT yesterday highly suspicious for pulmonary and left T6 vertebral body metastatic disease. EXAM: MRI THORACIC WITHOUT AND WITH CONTRAST TECHNIQUE: Multiplanar and multiecho pulse sequences of the thoracic spine were obtained without and with intravenous contrast. CONTRAST:  62mL MULTIHANCE GADOBENATE DIMEGLUMINE 529 MG/ML IV SOLN COMPARISON:  Chest CT 02/04/2018 FINDINGS: Limited cervical spine imaging:  Negative. Thoracic spine segmentation: Normal as demonstrated on the recent CT. Alignment: Exaggerated upper thoracic kyphosis. Mild degenerative appearing anterolisthesis of T2 on T3. Vertebrae: There is a mildly edematous and enhancing T1 superior endplate compression fracture which was subtle on the recent CT. See series 11, image 7 and series 5, image 7. The remainder of the T1 vertebra appears within normal limits. Destructive and enhancing mass occupying the left T6 vertebra. The infiltrative appearing mass tracks throughout the left T6 vertebra, mildly crossing midline to the right in both the anterior and posterior elements (series 8, image 20), and also infiltrates the left 6th costovertebral junction and posterior rib (series 8, image 19). The tumor encompasses 31 539 x 35 millimeters (AP by transverse by CC) and enhances fairly homogeneously. There is invasion of the left T6 neural foramen and epidural space with rightward displacement of the spinal cord and mild spinal cord compression (series 7, image 19).  Mild if any associated cord edema. Moderate to severe left T6 neural foraminal stenosis. The right neural foramen is spared. T6 vertebral body height is relatively maintained. No other thoracic vertebral or posterior rib tumor. Cord: Cord compression with perhaps faint abnormal cord edema at the T6 level. Above and below T6 the spinal cord signal and morphology are normal. No abnormal intradural enhancement. However, there does appear to be a 2 cm segment of left posterolateral  dural thickening and enhancement tracking caudally from the mass seen on series 11, image 8. No other dural thickening. Normal conus medullaris at T12-L1. Normal visible cauda equina nerve roots. Paraspinal and other soft tissues: Early left lateral extraosseous extension of tumor and paraspinal soft tissue involvement at T6. Other paraspinal soft tissues are negative. Posterior left upper lobe lung nodules redemonstrated on series 7, image 17. No pleural effusion. Negative visible upper abdominal viscera. Disc levels: Age concordant thoracic spine degeneration. No degenerative spinal stenosis. IMPRESSION: 1. Infiltrative and destructive tumor occupying the left T6 vertebra encompassing 31 x 39 x 35 mm. Extension into the epidural space, the left T6 neural foramen, the posterior left 6th rib, and adjacent paraspinal soft tissues. Note a linear 2 cm segment of left posterolateral dural thickening tracking inferiorly to the T7 level. 2. T6 level spinal cord compression with possible mild cord edema. Moderate to severe left T6 neural foraminal stenosis. 3. There is a mild acute to subacute T1 superior endplate compression fracture, but this appears to be a benign rather than pathologic fracture. 4. No other thoracic spine metastatic disease identified. Electronically Signed: By: Genevie Ann M.D. On: 02/24/2018 08:55   Ct Abdomen Pelvis W Contrast  Result Date: 02/24/2018 CLINICAL DATA:  Thoracic spine metastasis. Concern for GYN malignancy EXAM: CT ABDOMEN AND PELVIS WITH CONTRAST TECHNIQUE: Multidetector CT imaging of the abdomen and pelvis was performed using the standard protocol following bolus administration of intravenous contrast. CONTRAST:  142mL ISOVUE-300 IOPAMIDOL (ISOVUE-300) INJECTION 61% COMPARISON:  None. FINDINGS: Lower chest: Lung bases are clear. Hepatobiliary: No focal hepatic lesion. No biliary duct dilatation. Gallbladder is normal. Common bile duct is normal. Pancreas: Pancreas is normal. No  ductal dilatation. No pancreatic inflammation. Spleen: Normal spleen Adrenals/urinary tract: Adrenal glands and kidneys are normal. The ureters and bladder normal. Stomach/Bowel: Stomach, small bowel, appendix, and cecum are normal. Moderate volume stool in the ascending colon. Rectosigmoid colon normal. Vascular/Lymphatic: Abdominal aorta is normal caliber with atherosclerotic calcification. There is no retroperitoneal or periportal lymphadenopathy. No pelvic lymphadenopathy. Reproductive: Large mass emanating from the pelvis measuring 13.8 x 12.0 by 13.3 cm. The mass is bilobed. There is no normal uterine tissue identified. No normal ovarian tissue identified. There multiple surgical clips positioned between the mass and bladder which may relate to prior GYN surgery/hysterectomy. There is masslike extension into the LEFT adnexal region measuring 5.6 x 4.3 cm. Nodularity extends to the the ventral peritoneal wall and musculature with asymmetric thickening to 2.5 cm in the LEFT rectus abdominus muscle. This asymmetric thickening extends the level the umbilicus (image 37/1 Other: No ascites.  No free-fluid Musculoskeletal: No aggressive osseous lesion. IMPRESSION: 1. Large bilobed mass extending from the pelvis consistent with GYN malignancy. 2. Lobular extension into the LEFT adnexal region with further extension into the ventral peritoneal surface involving the LEFT rectus abdominal muscle to the level of the umbilicus. 3. No evidence bowel obstruction or renal obstruction. 4. No ascites. Electronically Signed   By: Suzy Bouchard M.D.   On: 02/24/2018 17:49  Scheduled Meds: . atorvastatin  80 mg Oral q1800  . [START ON 02/26/2018] enoxaparin (LOVENOX) injection  40 mg Subcutaneous Q24H  . feeding supplement (ENSURE ENLIVE)  237 mL Oral BID BM  . fentaNYL      . flumazenil      . levothyroxine  50 mcg Oral Daily  . LORazepam  1 mg Oral Q8H  . midazolam      . mometasone-formoterol  2 puff  Inhalation BID  . naloxone      . sertraline  50 mg Oral Daily  . triamterene-hydrochlorothiazide  1 tablet Oral Daily   Continuous Infusions:   LOS: 1 day    Time spent: [place], MD [facility]  If 7PM-7AM, please contact night-coverage [email or web address] Password [facility] [place] 02/25/2018, 2:07 PM

## 2018-02-25 NOTE — Procedures (Signed)
Interventional Radiology Procedure Note  Procedure: CT guided core biopsy of left pelvic mass  Complications: None  Estimated Blood Loss: < 10 mL  Findings: Lobulated tumor in left lateral pelvis just deep to abdominal wall targeted by CT.  18 G core biopsy x 4 via 17 G needle.  No immediate complications.  Venetia Night. Kathlene Cote, M.D Pager:  [phone]

## 2018-02-25 NOTE — Discharge Instructions (Signed)
Moderate Conscious Sedation, Adult, Care After  These instructions provide you with information about caring for yourself after your procedure. Your health care provider may also give you more specific instructions. Your treatment has been planned according to current medical practices, but problems sometimes occur. Call your health care provider if you have any problems or questions after your procedure.  What can I expect after the procedure?  After your procedure, it is common:   To feel sleepy for several hours.   To feel clumsy and have poor balance for several hours.   To have poor judgment for several hours.   To vomit if you eat too soon.    Follow these instructions at home:  For at least 24 hours after the procedure:     Do not:  ? Participate in activities where you could fall or become injured.  ? Drive.  ? Use heavy machinery.  ? Drink alcohol.  ? Take sleeping pills or medicines that cause drowsiness.  ? Make important decisions or sign legal documents.  ? Take care of children on your own.   Rest.  Eating and drinking   Follow the diet recommended by your health care provider.   If you vomit:  ? Drink water, juice, or soup when you can drink without vomiting.  ? Make sure you have little or no nausea before eating solid foods.  General instructions   Have a responsible adult stay with you until you are awake and alert.   Take over-the-counter and prescription medicines only as told by your health care provider.   If you smoke, do not smoke without supervision.   Keep all follow-up visits as told by your health care provider. This is important.  Contact a health care provider if:   You keep feeling nauseous or you keep vomiting.   You feel light-headed.   You develop a rash.   You have a fever.  Get help right away if:   You have trouble breathing.  This information is not intended to replace advice given to you by your health care provider. Make sure you discuss any questions you have  with your health care provider.  Document Released: 05/12/2013 Document Revised: 12/25/2015 Document Reviewed: 11/11/2015  [employer] Interactive Patient Education  2018 [employer]
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
## 2018-02-26 ENCOUNTER — Other Ambulatory Visit: Payer: Self-pay

## 2018-02-26 ENCOUNTER — Other Ambulatory Visit: Payer: Self-pay | Admitting: Neurosurgery

## 2018-02-26 LAB — INHIBIN B

## 2018-02-26 NOTE — Progress Notes (Signed)
Report called to [facility] at 3W at [facility] that will receive patient. [facility] transport set up. Jocelyn Lamer, RN that will be taking over care for patient notified.

## 2018-02-26 NOTE — Progress Notes (Signed)
Patient ID: Susan Bradford, female   DOB: [birth year], 84 y.o.   MRN: [id number] BP 133/78 (BP Location: Left Arm)   Pulse 84   Temp 98.2 F (36.8 C) (Oral)   Resp 17   Ht 5\' 5"  (1.651 m)   Wt 63.6 kg (140 lb 3.4 oz)   SpO2 97%   BMI 23.33 kg/m  Alert and oriented x 4,speech is clear and fluent Moving lower extremities well Plan for tumor resection, spinal stabilization and arthrodesis. I have spoken with [name] and explained risks and benefits, paralysis, bowel and bladder dysfunction, no pain relief, weakness in the lower extremities, and other risks. She understands and proceeds.

## 2018-02-26 NOTE — Progress Notes (Signed)
PROGRESS NOTE    Susan Bradford  AOZ:[id number] DOB: 05-18-1934 DOA: 03/02/2018 PCP: Merrilee Seashore, MD    Brief Narrative:  84 year old with past medical history relevant for hypertension, hyperlipidemia, hypothyroidism, depression/anxiety, asthma/COPD, ovarian cancer status post resection in April 2018 who was admitted on 02/22/2018 for severe back pain and found to have widely metastatic disease (likely ovarian cancer) and T6 tumor cord compression.   Assessment & Plan:   Principal Problem:   Spinal cord compression due to malignant neoplasm metastatic to spine [facility]) Active Problems:   Ovarian cancer, unspecified laterality ([place])   Metastatic cancer to spine ([place])   Back pain   #) T6 cord compression: Due to malignant metastases.  Neurosurgery has been consulted and the patient is pending OR. -Likely OR to: On 02/15/2018 by neurosurgery -N.p.o. at midnight -No steroids per neurosurgery  #) Widely metastatic cancer: Likely ovarian -Biopsy today on 02/25/2018 of abdominal/pelvis mass pending -Continue tramadol for pain control, patient has multiple allergies to stronger opiates  #) Hypertension/hyperlipidemia: -Continue triamterene-HCTZ 37.5-25 mg -Continue atorvastatin 80 mg daily  #) hypothyroidism : - Continue levothyroxine 50 mg daily  #) Asthma/COPD: -Continue ICS/LABA  #) Pain/psych: -Continue sertraline 50 mg daily -Continue PRN lorazepam  Fluids: Tolerating p.o. Electrolytes: Monitor and supplement  nutrition: Regular diet, n.p.o. at midnight for procedure  Prophylaxis: Enoxaparin  Disposition: Pending transfer to [facility] for OR  Full code   Consultants:   Neurosurgery  Oncology  Procedures:   Biopsy on 02/25/2018 by IR for abdominal/pelvic mass  Antimicrobials:   None   Subjective: Patient reports she is doing fairly well.  She is somewhat confused as quite a few things been happening but she denies any nausea, vomiting, diarrhea.  She  does not have much in the way of lower extremity weakness she reports.  Objective: Vitals:   02/25/18 1959 02/25/18 2107 02/26/18 0621 02/26/18 0759  BP:  118/63 116/70   Pulse:  64 66   Resp:  17 18   Temp:  98.1 F (36.7 C) 98.4 F (36.9 C)   TempSrc:  Oral Oral   SpO2: 98% 97% 99% 94%  Weight:      Height:        Intake/Output Summary (Last 24 hours) at 02/26/2018 1257 Last data filed at 02/26/2018 0805 Gross per 24 hour  Intake -  Output 800 ml  Net -800 ml   Filed Weights   02/24/18 1113  Weight: 63.6 kg (140 lb 3.4 oz)    Examination:  General exam: Appears calm and comfortable  Respiratory system: Clear to auscultation. Respiratory effort normal. Cardiovascular system: Regular rate and rhythm, no murmurs Gastrointestinal system: Abdomen is nondistended, soft and nontender. No organomegaly or masses felt. Normal bowel sounds heard. Central nervous system: Alert and oriented.  Bilateral extensors and flexors and lower extremity is 4 out of 5 strength, diminished sensation in lower extremity. Extremities: No lower extremity edema Skin: No rashes on visible skin Psychiatry: Judgement and insight appear normal. Mood & affect appropriate.     Data Reviewed: I have personally reviewed following labs and imaging studies  CBC: Recent Labs  Lab 03/02/2018 1657 02/25/18 0354  WBC 7.3 6.2  NEUTROABS 6.5  --   HGB 12.9 12.6  HCT 39.4 37.8  MCV 87.9 88.1  PLT 212 962   Basic Metabolic Panel: Recent Labs  Lab 02/18/2018 1657 02/25/18 0354  NA 135 136  K 3.8 3.6  CL 94* 93*  CO2 30 29  GLUCOSE 132*  110*  BUN 21 14  CREATININE 0.70 0.59  CALCIUM 11.2* 11.1*   GFR: Estimated Creatinine Clearance: 49.6 mL/min (by C-G formula based on SCr of 0.59 mg/dL). Liver Function Tests: Recent Labs  Lab 03/02/2018 1657  AST 25  ALT 28  ALKPHOS 104  BILITOT 0.9  PROT 7.2  ALBUMIN 3.5   Recent Labs  Lab 03/04/2018 1656  LIPASE 24   No results for input(s): AMMONIA  in the last 168 hours. Coagulation Profile: Recent Labs  Lab 02/25/18 0919  INR 1.02   Cardiac Enzymes: Recent Labs  Lab 02/06/2018 1656  TROPONINI <0.03   BNP (last 3 results) No results for input(s): PROBNP in the last 8760 hours. HbA1C: No results for input(s): HGBA1C in the last 72 hours. CBG: No results for input(s): GLUCAP in the last 168 hours. Lipid Profile: No results for input(s): CHOL, HDL, LDLCALC, TRIG, CHOLHDL, LDLDIRECT in the last 72 hours. Thyroid Function Tests: No results for input(s): TSH, T4TOTAL, FREET4, T3FREE, THYROIDAB in the last 72 hours. Anemia Panel: No results for input(s): VITAMINB12, FOLATE, FERRITIN, TIBC, IRON, RETICCTPCT in the last 72 hours. Sepsis Labs: No results for input(s): PROCALCITON, LATICACIDVEN in the last 168 hours.  No results found for this or any previous visit (from the past 240 hour(s)).       Radiology Studies: Ct Abdomen Pelvis W Contrast  Result Date: 02/24/2018 CLINICAL DATA:  Thoracic spine metastasis. Concern for GYN malignancy EXAM: CT ABDOMEN AND PELVIS WITH CONTRAST TECHNIQUE: Multidetector CT imaging of the abdomen and pelvis was performed using the standard protocol following bolus administration of intravenous contrast. CONTRAST:  159mL ISOVUE-300 IOPAMIDOL (ISOVUE-300) INJECTION 61% COMPARISON:  None. FINDINGS: Lower chest: Lung bases are clear. Hepatobiliary: No focal hepatic lesion. No biliary duct dilatation. Gallbladder is normal. Common bile duct is normal. Pancreas: Pancreas is normal. No ductal dilatation. No pancreatic inflammation. Spleen: Normal spleen Adrenals/urinary tract: Adrenal glands and kidneys are normal. The ureters and bladder normal. Stomach/Bowel: Stomach, small bowel, appendix, and cecum are normal. Moderate volume stool in the ascending colon. Rectosigmoid colon normal. Vascular/Lymphatic: Abdominal aorta is normal caliber with atherosclerotic calcification. There is no retroperitoneal or  periportal lymphadenopathy. No pelvic lymphadenopathy. Reproductive: Large mass emanating from the pelvis measuring 13.8 x 12.0 by 13.3 cm. The mass is bilobed. There is no normal uterine tissue identified. No normal ovarian tissue identified. There multiple surgical clips positioned between the mass and bladder which may relate to prior GYN surgery/hysterectomy. There is masslike extension into the LEFT adnexal region measuring 5.6 x 4.3 cm. Nodularity extends to the the ventral peritoneal wall and musculature with asymmetric thickening to 2.5 cm in the LEFT rectus abdominus muscle. This asymmetric thickening extends the level the umbilicus (image 62/6 Other: No ascites.  No free-fluid Musculoskeletal: No aggressive osseous lesion. IMPRESSION: 1. Large bilobed mass extending from the pelvis consistent with GYN malignancy. 2. Lobular extension into the LEFT adnexal region with further extension into the ventral peritoneal surface involving the LEFT rectus abdominal muscle to the level of the umbilicus. 3. No evidence bowel obstruction or renal obstruction. 4. No ascites. Electronically Signed   By: Suzy Bouchard M.D.   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 Imaging evidence of recurrent tumor in the pelvis with peritoneal spread and metastases to the thoracic spine and lungs. Biopsy needed to confirm recurrence. EXAM: CT GUIDED CORE BIOPSY OF PERITONEAL MASS ANESTHESIA/SEDATION:  2.0 mg IV Versed; 50 mcg IV Fentanyl Total Moderate Sedation Time:  10 minutes. The patient's level of consciousness and physiologic status were continuously monitored during the procedure by Radiology nursing. PROCEDURE: The procedure risks, benefits, and alternatives were explained to the patient. Questions regarding the procedure were encouraged and answered. The patient understands and consents to the procedure. CT was performed through the  pelvis in a supine position. The left lower abdominal wall was prepped with chlorhexidine in a sterile fashion, and a sterile drape was applied covering the operative field. A sterile gown and sterile gloves were used for the procedure. Local anesthesia was provided with 1% Lidocaine. A 17 gauge trocar needle was advanced into tumor within the left lower pelvis. After confirming needle tip position, 4 separate coaxial 18 gauge core biopsy samples were obtained and submitted in formalin. Additional imaging was performed after outer needle removal. COMPLICATIONS: None FINDINGS: Lobular tumor mass extending to abut the left lower abdominal wall just above the hip joint was targeted. Solid tissue was obtained. IMPRESSION: CT-guided core biopsy performed of tumor within the left lower pelvic peritoneal cavity. Electronically Signed   By: Aletta Edouard M.D.   On: 02/25/2018 15:53        Scheduled Meds: . atorvastatin  80 mg Oral q1800  . enoxaparin (LOVENOX) injection  40 mg Subcutaneous Q24H  . feeding supplement (ENSURE ENLIVE)  237 mL Oral BID BM  . levothyroxine  50 mcg Oral Daily  . LORazepam  1 mg Oral Q8H  . mometasone-formoterol  2 puff Inhalation BID  . sertraline  50 mg Oral Daily  . triamterene-hydrochlorothiazide  1 tablet Oral Daily   Continuous Infusions:   LOS: 2 days    Time spent: [place], MD [facility]  If 7PM-7AM, please contact night-coverage [email or web address] Password [facility] 02/26/2018, 12:57 PM

## 2018-02-27 ENCOUNTER — Inpatient Hospital Stay (HOSPITAL_COMMUNITY): Payer: Medicare Other | Admitting: Certified Registered"

## 2018-02-27 ENCOUNTER — Encounter (HOSPITAL_COMMUNITY): Payer: Self-pay | Admitting: Orthopedic Surgery

## 2018-02-27 ENCOUNTER — Inpatient Hospital Stay (HOSPITAL_COMMUNITY): Admission: EM | Disposition: E | Payer: Self-pay | Source: Home / Self Care | Attending: Internal Medicine

## 2018-02-27 ENCOUNTER — Inpatient Hospital Stay (HOSPITAL_COMMUNITY): Payer: Medicare Other

## 2018-02-27 DIAGNOSIS — C569 Malignant neoplasm of unspecified ovary: Secondary | ICD-10-CM | POA: Diagnosis present

## 2018-02-27 DIAGNOSIS — E039 Hypothyroidism, unspecified: Secondary | ICD-10-CM

## 2018-02-27 HISTORY — PX: POSTERIOR LUMBAR FUSION 4 LEVEL: SHX6037

## 2018-02-27 LAB — TYPE AND SCREEN
ABO/RH(D): O POS
Antibody Screen: NEGATIVE

## 2018-02-27 LAB — SURGICAL PCR SCREEN
MRSA, PCR: NEGATIVE
STAPHYLOCOCCUS AUREUS: NEGATIVE

## 2018-02-27 LAB — ANTI MULLERIAN HORMONE

## 2018-02-27 LAB — ABO/RH: ABO/RH(D): O POS

## 2018-02-27 SURGERY — POSTERIOR LUMBAR FUSION 4 LEVEL
Anesthesia: General

## 2018-02-27 MED ORDER — LIDOCAINE-EPINEPHRINE 0.5 %-1:200000 IJ SOLN
INTRAMUSCULAR | Status: DC | PRN
  Administered 2018-02-27: 15 mL

## 2018-02-27 MED ORDER — LORATADINE 10 MG PO TABS
10.0000 mg | ORAL_TABLET | Freq: Every day | ORAL | Status: DC
  Administered 2018-02-28 – 2018-03-02 (×3): 10 mg via ORAL
  Filled 2018-02-27 (×3): qty 1

## 2018-02-27 MED ORDER — BUPIVACAINE LIPOSOME 1.3 % IJ SUSP
20.0000 mL | INTRAMUSCULAR | Status: DC
  Filled 2018-02-27: qty 20

## 2018-02-27 MED ORDER — FENTANYL CITRATE (PF) 100 MCG/2ML IJ SOLN
25.0000 ug | INTRAMUSCULAR | Status: DC | PRN
  Administered 2018-02-27 (×2): 25 ug via INTRAVENOUS

## 2018-02-27 MED ORDER — ONDANSETRON HCL 4 MG/2ML IJ SOLN
INTRAMUSCULAR | Status: DC | PRN
  Administered 2018-02-27: 4 mg via INTRAVENOUS

## 2018-02-27 MED ORDER — GABAPENTIN 300 MG PO CAPS
300.0000 mg | ORAL_CAPSULE | Freq: Three times a day (TID) | ORAL | Status: DC
  Administered 2018-02-27 – 2018-03-01 (×5): 300 mg via ORAL
  Filled 2018-02-27 (×5): qty 1

## 2018-02-27 MED ORDER — PHENOL 1.4 % MT LIQD: 1.0000 | OROMUCOSAL | Status: DC | PRN

## 2018-02-27 MED ORDER — MAGNESIUM CITRATE PO SOLN: 1.0000 | Freq: Once | ORAL | Status: DC | PRN

## 2018-02-27 MED ORDER — BISACODYL 5 MG PO TBEC
5.0000 mg | DELAYED_RELEASE_TABLET | Freq: Every day | ORAL | Status: DC | PRN
  Administered 2018-02-27: 5 mg via ORAL
  Filled 2018-02-27: qty 1

## 2018-02-27 MED ORDER — DEXAMETHASONE SODIUM PHOSPHATE 10 MG/ML IJ SOLN
INTRAMUSCULAR | Status: DC | PRN
  Administered 2018-02-27: 10 mg via INTRAVENOUS

## 2018-02-27 MED ORDER — LIDOCAINE HCL (CARDIAC) PF 100 MG/5ML IV SOSY
PREFILLED_SYRINGE | INTRAVENOUS | Status: DC | PRN
  Administered 2018-02-27: 60 mg via INTRAVENOUS

## 2018-02-27 MED ORDER — ESMOLOL HCL 100 MG/10ML IV SOLN
INTRAVENOUS | Status: AC
  Filled 2018-02-27: qty 10

## 2018-02-27 MED ORDER — PHENYLEPHRINE 40 MCG/ML (10ML) SYRINGE FOR IV PUSH (FOR BLOOD PRESSURE SUPPORT)
PREFILLED_SYRINGE | INTRAVENOUS | Status: DC | PRN
  Administered 2018-02-27: 160 ug via INTRAVENOUS

## 2018-02-27 MED ORDER — SODIUM CHLORIDE 0.9 % IV SOLN: INTRAVENOUS | Status: DC | PRN

## 2018-02-27 MED ORDER — PROPOFOL 10 MG/ML IV BOLUS
INTRAVENOUS | Status: AC
  Filled 2018-02-27: qty 20

## 2018-02-27 MED ORDER — LACTATED RINGERS IV SOLN
INTRAVENOUS | Status: DC | PRN
  Administered 2018-02-27 (×2): via INTRAVENOUS

## 2018-02-27 MED ORDER — FENTANYL CITRATE (PF) 100 MCG/2ML IJ SOLN
INTRAMUSCULAR | Status: AC
  Administered 2018-02-27: 25 ug via INTRAVENOUS
  Filled 2018-02-27: qty 2

## 2018-02-27 MED ORDER — ROCURONIUM BROMIDE 10 MG/ML (PF) SYRINGE
PREFILLED_SYRINGE | INTRAVENOUS | Status: AC
  Filled 2018-02-27: qty 10

## 2018-02-27 MED ORDER — SUGAMMADEX SODIUM 200 MG/2ML IV SOLN
INTRAVENOUS | Status: AC
  Filled 2018-02-27: qty 2

## 2018-02-27 MED ORDER — FENTANYL CITRATE (PF) 250 MCG/5ML IJ SOLN
INTRAMUSCULAR | Status: AC
  Filled 2018-02-27: qty 5

## 2018-02-27 MED ORDER — THROMBIN 5000 UNITS EX SOLR
CUTANEOUS | Status: AC
  Filled 2018-02-27: qty 5000

## 2018-02-27 MED ORDER — LIDOCAINE-EPINEPHRINE 0.5 %-1:200000 IJ SOLN
INTRAMUSCULAR | Status: AC
  Filled 2018-02-27: qty 1

## 2018-02-27 MED ORDER — MEPERIDINE HCL 25 MG/ML IJ SOLN: 6.2500 mg | INTRAMUSCULAR | Status: DC | PRN

## 2018-02-27 MED ORDER — POTASSIUM CHLORIDE IN NACL 20-0.9 MEQ/L-% IV SOLN
INTRAVENOUS | Status: DC
  Filled 2018-02-27: qty 1000

## 2018-02-27 MED ORDER — ESMOLOL HCL 100 MG/10ML IV SOLN
INTRAVENOUS | Status: DC | PRN
  Administered 2018-02-27: 10 mg via INTRAVENOUS

## 2018-02-27 MED ORDER — FENTANYL CITRATE (PF) 100 MCG/2ML IJ SOLN
25.0000 ug | INTRAMUSCULAR | Status: DC | PRN
  Administered 2018-02-27: 50 ug via INTRAVENOUS
  Filled 2018-02-27: qty 2

## 2018-02-27 MED ORDER — LIDOCAINE 2% (20 MG/ML) 5 ML SYRINGE
INTRAMUSCULAR | Status: AC
  Filled 2018-02-27: qty 5

## 2018-02-27 MED ORDER — ROCURONIUM BROMIDE 100 MG/10ML IV SOLN
INTRAVENOUS | Status: DC | PRN
  Administered 2018-02-27: 20 mg via INTRAVENOUS
  Administered 2018-02-27: 50 mg via INTRAVENOUS

## 2018-02-27 MED ORDER — GLYCOPYRROLATE 0.2 MG/ML IJ SOLN
INTRAMUSCULAR | Status: DC | PRN
  Administered 2018-02-27: 0.2 mg via INTRAVENOUS

## 2018-02-27 MED ORDER — SUGAMMADEX SODIUM 200 MG/2ML IV SOLN
INTRAVENOUS | Status: DC | PRN
  Administered 2018-02-27: 200 mg via INTRAVENOUS

## 2018-02-27 MED ORDER — ONDANSETRON HCL 4 MG/2ML IJ SOLN
INTRAMUSCULAR | Status: AC
  Filled 2018-02-27: qty 2

## 2018-02-27 MED ORDER — HYDROCODONE-ACETAMINOPHEN 5-325 MG PO TABS
1.0000 | ORAL_TABLET | ORAL | Status: DC | PRN
  Administered 2018-02-28 (×2): 2 via ORAL
  Filled 2018-02-27 (×2): qty 2

## 2018-02-27 MED ORDER — SENNOSIDES-DOCUSATE SODIUM 8.6-50 MG PO TABS: 1.0000 | ORAL_TABLET | Freq: Every evening | ORAL | Status: DC | PRN

## 2018-02-27 MED ORDER — DEXAMETHASONE SODIUM PHOSPHATE 10 MG/ML IJ SOLN
INTRAMUSCULAR | Status: AC
  Filled 2018-02-27: qty 1

## 2018-02-27 MED ORDER — LACTATED RINGERS IV SOLN: INTRAVENOUS | Status: DC

## 2018-02-27 MED ORDER — ZOLPIDEM TARTRATE 5 MG PO TABS
5.0000 mg | ORAL_TABLET | Freq: Every evening | ORAL | Status: DC | PRN
  Administered 2018-03-03 – 2018-03-05 (×3): 5 mg via ORAL
  Filled 2018-02-27 (×3): qty 1

## 2018-02-27 MED ORDER — SODIUM CHLORIDE 0.9% FLUSH
3.0000 mL | INTRAVENOUS | Status: DC | PRN
  Administered 2018-03-02: 3 mL via INTRAVENOUS
  Filled 2018-02-27: qty 3

## 2018-02-27 MED ORDER — THROMBIN (RECOMBINANT) 20000 UNITS EX SOLR
CUTANEOUS | Status: AC
  Filled 2018-02-27: qty 20000

## 2018-02-27 MED ORDER — CEFAZOLIN SODIUM-DEXTROSE 2-4 GM/100ML-% IV SOLN
INTRAVENOUS | Status: AC
  Filled 2018-02-27: qty 100

## 2018-02-27 MED ORDER — BUPIVACAINE LIPOSOME 1.3 % IJ SUSP
INTRAMUSCULAR | Status: DC | PRN
  Administered 2018-02-27: 20 mL

## 2018-02-27 MED ORDER — THROMBIN 20000 UNITS EX SOLR
CUTANEOUS | Status: DC | PRN
  Administered 2018-02-27: 12:00:00 via TOPICAL

## 2018-02-27 MED ORDER — SODIUM CHLORIDE 0.9 % IV SOLN: 250.0000 mL | INTRAVENOUS | Status: DC

## 2018-02-27 MED ORDER — MEPERIDINE HCL 50 MG/ML IJ SOLN: 6.2500 mg | INTRAMUSCULAR | Status: DC | PRN

## 2018-02-27 MED ORDER — THROMBIN 5000 UNITS EX SOLR
OROMUCOSAL | Status: DC | PRN
  Administered 2018-02-27: 14:00:00 via TOPICAL

## 2018-02-27 MED ORDER — ALBUMIN HUMAN 5 % IV SOLN
INTRAVENOUS | Status: DC | PRN
  Administered 2018-02-27: 14:00:00 via INTRAVENOUS

## 2018-02-27 MED ORDER — SODIUM CHLORIDE 0.9% FLUSH
3.0000 mL | Freq: Two times a day (BID) | INTRAVENOUS | Status: DC
  Administered 2018-02-27 – 2018-03-07 (×11): 3 mL via INTRAVENOUS

## 2018-02-27 MED ORDER — PHENYLEPHRINE HCL 10 MG/ML IJ SOLN
INTRAMUSCULAR | Status: DC | PRN
  Administered 2018-02-27: 40 ug via INTRAVENOUS

## 2018-02-27 MED ORDER — DOCUSATE SODIUM 100 MG PO CAPS
100.0000 mg | ORAL_CAPSULE | Freq: Two times a day (BID) | ORAL | Status: DC
  Administered 2018-02-27 – 2018-03-02 (×6): 100 mg via ORAL
  Filled 2018-02-27 (×6): qty 1

## 2018-02-27 MED ORDER — LACTATED RINGERS IV SOLN
INTRAVENOUS | Status: DC | PRN
  Administered 2018-02-27: 12:00:00 via INTRAVENOUS

## 2018-02-27 MED ORDER — DIAZEPAM 5 MG PO TABS
5.0000 mg | ORAL_TABLET | Freq: Four times a day (QID) | ORAL | Status: DC | PRN
  Administered 2018-02-27 – 2018-02-28 (×2): 5 mg via ORAL
  Filled 2018-02-27 (×3): qty 1

## 2018-02-27 MED ORDER — BUPIVACAINE HCL (PF) 0.5 % IJ SOLN
INTRAMUSCULAR | Status: AC
  Filled 2018-02-27: qty 30

## 2018-02-27 MED ORDER — CEFAZOLIN SODIUM-DEXTROSE 2-3 GM-%(50ML) IV SOLR
INTRAVENOUS | Status: DC | PRN
  Administered 2018-02-27: 2 g via INTRAVENOUS

## 2018-02-27 MED ORDER — 0.9 % SODIUM CHLORIDE (POUR BTL) OPTIME
TOPICAL | Status: DC | PRN
  Administered 2018-02-27: 1000 mL

## 2018-02-27 MED ORDER — MENTHOL 3 MG MT LOZG: 1.0000 | LOZENGE | OROMUCOSAL | Status: DC | PRN

## 2018-02-27 MED ORDER — EPHEDRINE SULFATE 50 MG/ML IJ SOLN
INTRAMUSCULAR | Status: DC | PRN
  Administered 2018-02-27: 10 mg via INTRAVENOUS
  Administered 2018-02-27 (×2): 5 mg via INTRAVENOUS
  Administered 2018-02-27: 15 mg via INTRAVENOUS

## 2018-02-27 MED ORDER — PHENYLEPHRINE 40 MCG/ML (10ML) SYRINGE FOR IV PUSH (FOR BLOOD PRESSURE SUPPORT)
PREFILLED_SYRINGE | INTRAVENOUS | Status: AC
  Filled 2018-02-27: qty 10

## 2018-02-27 MED ORDER — PROPOFOL 10 MG/ML IV BOLUS
INTRAVENOUS | Status: DC | PRN
  Administered 2018-02-27: 120 mg via INTRAVENOUS

## 2018-02-27 SURGICAL SUPPLY — 69 items
ADH SKN CLS APL DERMABOND .7 (GAUZE/BANDAGES/DRESSINGS) ×1
APL SKNCLS STERI-STRIP NONHPOA (GAUZE/BANDAGES/DRESSINGS)
BENZOIN TINCTURE PRP APPL 2/3 (GAUZE/BANDAGES/DRESSINGS) IMPLANT
BLADE CLIPPER SURG (BLADE) IMPLANT
BUR MATCHSTICK NEURO 3.0 LAGG (BURR) ×3 IMPLANT
BUR PRECISION FLUTE 5.0 (BURR) ×3 IMPLANT
CANISTER SUCT 3000ML PPV (MISCELLANEOUS) ×3 IMPLANT
CARTRIDGE OIL MAESTRO DRILL (MISCELLANEOUS) ×1 IMPLANT
CEMENT BONE KYPHX HV R (Orthopedic Implant) ×2 IMPLANT
CLOSURE WOUND 1/2 X4 (GAUZE/BANDAGES/DRESSINGS)
CONT SPEC 4OZ CLIKSEAL STRL BL (MISCELLANEOUS) ×3 IMPLANT
COVER BACK TABLE 60X90IN (DRAPES) IMPLANT
DECANTER SPIKE VIAL GLASS SM (MISCELLANEOUS) ×3 IMPLANT
DERMABOND ADVANCED (GAUZE/BANDAGES/DRESSINGS) ×2
DERMABOND ADVANCED .7 DNX12 (GAUZE/BANDAGES/DRESSINGS) ×1 IMPLANT
DIFFUSER DRILL AIR PNEUMATIC (MISCELLANEOUS) ×3 IMPLANT
DRAPE C-ARM 42X72 X-RAY (DRAPES) ×6 IMPLANT
DRAPE C-ARMOR (DRAPES) IMPLANT
DRAPE LAPAROTOMY 100X72X124 (DRAPES) ×3 IMPLANT
DRAPE POUCH INSTRU U-SHP 10X18 (DRAPES) ×3 IMPLANT
DRAPE SURG 17X23 STRL (DRAPES) ×3 IMPLANT
DURAPREP 26ML APPLICATOR (WOUND CARE) ×5 IMPLANT
ELECT REM PT RETURN 9FT ADLT (ELECTROSURGICAL) ×3
ELECTRODE REM PT RTRN 9FT ADLT (ELECTROSURGICAL) ×1 IMPLANT
GAUZE SPONGE 4X4 12PLY STRL (GAUZE/BANDAGES/DRESSINGS) IMPLANT
GAUZE SPONGE 4X4 16PLY XRAY LF (GAUZE/BANDAGES/DRESSINGS) IMPLANT
GLOVE ECLIPSE 6.5 STRL STRAW (GLOVE) ×6 IMPLANT
GLOVE EXAM NITRILE LRG STRL (GLOVE) IMPLANT
GLOVE EXAM NITRILE XL STR (GLOVE) IMPLANT
GLOVE EXAM NITRILE XS STR PU (GLOVE) IMPLANT
GOWN STRL REUS W/ TWL LRG LVL3 (GOWN DISPOSABLE) ×2 IMPLANT
GOWN STRL REUS W/ TWL XL LVL3 (GOWN DISPOSABLE) IMPLANT
GOWN STRL REUS W/TWL 2XL LVL3 (GOWN DISPOSABLE) IMPLANT
GOWN STRL REUS W/TWL LRG LVL3 (GOWN DISPOSABLE) ×6
GOWN STRL REUS W/TWL XL LVL3 (GOWN DISPOSABLE)
GRAFT BONE MAGNIFUSE 1X10CM (Bone Implant) ×2 IMPLANT
HEMOSTAT POWDER KIT SURGIFOAM (HEMOSTASIS) ×2 IMPLANT
KIT BASIN OR (CUSTOM PROCEDURE TRAY) ×3 IMPLANT
KIT POSITION SURG JACKSON T1 (MISCELLANEOUS) ×3 IMPLANT
KIT TURNOVER KIT B (KITS) ×3 IMPLANT
MILL MEDIUM DISP (BLADE) IMPLANT
MIXER KYPHON (MISCELLANEOUS) ×2 IMPLANT
NDL HYPO 21X1.5 SAFETY (NEEDLE) IMPLANT
NDL HYPO 25X1 1.5 SAFETY (NEEDLE) ×1 IMPLANT
NDL SPNL 18GX3.5 QUINCKE PK (NEEDLE) IMPLANT
NEEDLE HYPO 21X1.5 SAFETY (NEEDLE) ×3 IMPLANT
NEEDLE HYPO 25X1 1.5 SAFETY (NEEDLE) ×3 IMPLANT
NEEDLE SPNL 18GX3.5 QUINCKE PK (NEEDLE) IMPLANT
NS IRRIG 1000ML POUR BTL (IV SOLUTION) ×3 IMPLANT
OIL CARTRIDGE MAESTRO DRILL (MISCELLANEOUS) ×3
PACK LAMINECTOMY NEURO (CUSTOM PROCEDURE TRAY) ×3 IMPLANT
PAD ARMBOARD 7.5X6 YLW CONV (MISCELLANEOUS) ×9 IMPLANT
ROD RELINE-O 5.5X120MM LORD (Rod) ×4 IMPLANT
SCREW LOCK RELINE 5.5 TULIP (Screw) ×16 IMPLANT
SCREW RELINE 4.5X35 POLYAXIAL (Screw) ×6 IMPLANT
SCREW RELINE-O POLY 4.0X35MM (Screw) ×10 IMPLANT
SPONGE LAP 4X18 RFD (DISPOSABLE) IMPLANT
SPONGE SURGIFOAM ABS GEL 100 (HEMOSTASIS) ×3 IMPLANT
STRIP CLOSURE SKIN 1/2X4 (GAUZE/BANDAGES/DRESSINGS) IMPLANT
SUT PROLENE 6 0 BV (SUTURE) IMPLANT
SUT VIC AB 0 CT1 18XCR BRD8 (SUTURE) ×1 IMPLANT
SUT VIC AB 0 CT1 8-18 (SUTURE) ×3
SUT VIC AB 2-0 CT1 18 (SUTURE) ×3 IMPLANT
SUT VIC AB 3-0 SH 8-18 (SUTURE) ×3 IMPLANT
SYRINGE 20CC LL (MISCELLANEOUS) ×2 IMPLANT
TOWEL GREEN STERILE (TOWEL DISPOSABLE) ×3 IMPLANT
TOWEL GREEN STERILE FF (TOWEL DISPOSABLE) ×3 IMPLANT
TRAY KYPHOPAK 20/3 ONESTEP 1ST (MISCELLANEOUS) ×2 IMPLANT
WATER STERILE IRR 1000ML POUR (IV SOLUTION) ×3 IMPLANT

## 2018-02-27 NOTE — Care Management Important Message (Signed)
Important Message  Patient Details  Name: Susan Bradford MRN: [id number] Date of Birth: [birth year]   Medicare Important Message Given:  Yes    [name] 03/04/2018, 2:52 PM

## 2018-02-27 NOTE — Anesthesia Preprocedure Evaluation (Addendum)
Anesthesia Evaluation  Patient identified by MRN, date of birth, ID band Patient awake    Reviewed: Allergy & Precautions, H&P , NPO status , Patient's Chart, lab work & pertinent test results, reviewed documented beta blocker date and time   Airway Mallampati: II  TM Distance: >3 FB Neck ROM: full    Dental no notable dental hx. (+) Teeth Intact, Dental Advisory Given   Pulmonary COPD,  COPD inhaler, former smoker,    Pulmonary exam normal breath sounds clear to auscultation       Cardiovascular Exercise Tolerance: Good hypertension, Pt. on medications Normal cardiovascular exam Rhythm:regular Rate:Normal     Neuro/Psych    GI/Hepatic   Endo/Other  Hypothyroidism   Renal/GU   negative genitourinary   Musculoskeletal   Abdominal   Peds  Hematology negative hematology ROS (+)   Anesthesia Other Findings   Reproductive/Obstetrics negative OB ROS                           Anesthesia Physical Anesthesia Plan  ASA: III  Anesthesia Plan: General   Post-op Pain Management:    Induction: Intravenous  PONV Risk Score and Plan: 3 and Ondansetron, Treatment may vary due to age or medical condition and Dexamethasone  Airway Management Planned: Oral ETT  Additional Equipment:   Intra-op Plan:   Post-operative Plan: Extubation in OR and Possible Post-op intubation/ventilation  Informed Consent: I have reviewed the patients History and Physical, chart, labs and discussed the procedure including the risks, benefits and alternatives for the proposed anesthesia with the patient or authorized representative who has indicated his/her understanding and acceptance.   Dental Advisory Given  Plan Discussed with: CRNA, Anesthesiologist and Surgeon  Anesthesia Plan Comments: (  )       Anesthesia Quick Evaluation

## 2018-02-27 NOTE — Op Note (Addendum)
02/22/2018  4:46 PM  PATIENT:  Susan Bradford  84 y.o. female with ovarian CA, and a presumed metastasis to T6 causing a pathologic fracture and cord compression  PRE-OPERATIVE DIAGNOSIS:  Spinal  Tumor T6 metastatic, spinal instability  POST-OPERATIVE DIAGNOSIS:  spinal metastatic tumor T6, spinal instability, pathologic fracture T6  PROCEDURE:  Procedure(s): epidural tumor resection T6 Thoracic 4 to Thoracic 8 posterolateral arthrodesis with allograft morsels(bone in a bag) Segmental pedicle screw fixation T4-T8(nuvasive)  SURGEON:  Surgeon(s): Ashok Pall, MD Kary Kos, MD  ASSISTANTS:Cram, Dominica Severin  ANESTHESIA:   general  EBL:  Total I/O In: 1250 [I.V.:1000; IV Piggyback:250] Out: 62 [Urine:310; Blood:150]  BLOOD ADMINISTERED:none  COUNT:per nursing  DRAINS: none   SPECIMEN:  Source of Specimen:  T6 vertebra  DICTATION: Susan Bradford is a 84 y.o. female whom was taken to the operating room intubated, and placed under a general anesthetic without difficulty. A foley catheter was placed under sterile conditions. She was positioned prone on a Jackson stable with all pressure points properly padded.  Her thoracic region was prepped and draped in a sterile manner. I infiltrated 10cc's 1/2%lidocaine/1:2000,000 strength epinephrine into the planned incision. I opened the skin with a 10 blade and took the incision down to the thoracolumbar fascia. I exposed the lamina of T4,5,6,7,and 8 in a subperiosteal fashion bilaterally. I confirmed my location via locating the tumor.   I placed self retaining retractors and started the tumor resection.  I resected the vast majority of the tumor at T6. It was a monotonous, greyish white soft mass, non vascular, pushing on the thecal sac. I removed the tumor with pituitary forceps, and curettes. I was able to remove tumor from the lateral canal, anterior to the theca and in the bone. The dura was nicely pulsatile once the resection was complete. The  specimen was sent to Pathology for both frozen and permanent sectioning I decorticated the lateral bone at T4,5,6,7and 8, on the right side. I then placed allograft morsels on the decorticated surfaces to complete the posterolateral arthrodesis.  I placed pedicle screws at T 4,5,7,and 8, using fluoroscopic guidance. I drilled a pilot hole, then cannulated the pedicle with a bone probe at each site. I then tapped each pedicle, assessing each site for pedicle violations. No cutouts were appreciated. Screws (nuvasive) were then placed at each site without difficulty. We attached rods and locking caps with the appropriate tools. The locking caps were secured with torque limited screwdrivers. Final films were performed and the final construct appeared to be in good position.  We closed the wound in a layered fashion. We approximated the thoracolumbar fascia, subcutaneous, and subcuticular planes with vicryl sutures. I used dermabond for a sterile dressing.     PLAN OF CARE: Admit to inpatient   PATIENT DISPOSITION:  PACU - hemodynamically stable.   Delay start of Pharmacological VTE agent (>24hrs) due to surgical blood loss or risk of bleeding:  yes

## 2018-02-27 NOTE — Anesthesia Procedure Notes (Signed)
Procedure Name: Intubation Date/Time: 02/15/2018 12:30 PM Performed by: Harden Mo, CRNA Pre-anesthesia Checklist: Patient identified, Emergency Drugs available, Suction available and Patient being monitored Patient Re-evaluated:Patient Re-evaluated prior to induction Oxygen Delivery Method: Circle System Utilized Preoxygenation: Pre-oxygenation with 100% oxygen Induction Type: IV induction Ventilation: Mask ventilation without difficulty Laryngoscope Size: Miller and 2 Grade View: Grade I Tube type: Oral Tube size: 7.0 mm Number of attempts: 1 Airway Equipment and Method: Stylet and Oral airway Placement Confirmation: ETT inserted through vocal cords under direct vision,  positive ETCO2 and breath sounds checked- equal and bilateral Secured at: 21 cm Tube secured with: Tape Dental Injury: Teeth and Oropharynx as per pre-operative assessment

## 2018-02-27 NOTE — Transfer of Care (Signed)
Immediate Anesthesia Transfer of Care Note  Patient: Susan Bradford  Procedure(s) Performed: Thoracic 6 to Thoracic 10 fusion with pedicle screws/posterolateral arthrodesis/tumor resection at Thoracic 8 (N/A )  Patient Location: PACU  Anesthesia Type:General  Level of Consciousness: awake and alert   Airway & Oxygen Therapy: Patient Spontanous Breathing and Patient connected to face mask oxygen  Post-op Assessment: Report given to RN and Post -op Vital signs reviewed and stable  Post vital signs: Reviewed and stable  Last Vitals:  Vitals Value Taken Time  BP 143/62 02/19/2018  4:23 PM  Temp    Pulse 70 02/06/2018  4:28 PM  Resp 23 02/13/2018  4:28 PM  SpO2 99 % 02/04/2018  4:28 PM  Vitals shown include unvalidated device data.  Last Pain:  Vitals:   02/07/2018 0941  TempSrc:   PainSc: 3       Patients Stated Pain Goal: 3 (16/10/96 0454)  Complications: No apparent anesthesia complications

## 2018-02-27 NOTE — Progress Notes (Addendum)
PROGRESS NOTE  Susan Bradford HYW:[id number] DOB: October 06, 1933 DOA: 02/19/2018 PCP: Merrilee Seashore, MD  HPI/Recap of past 47 hours: 84 year old with past medical history relevant for hypertension, hyperlipidemia, hypothyroidism, depression/anxiety, asthma/COPD, ovarian cancer status post resection in April 2018 who was admitted on 02/22/2018 for severe back pain and found to have widely metastatic disease (likely ovarian as primary) and T6 tumor cord compression. Pt admitted for further management.  Today, pt denies any new complaints, back pain controlled with pain meds. For surgery today. Met daughter at bedside, all questions answered.   Assessment/Plan: Principal Problem:   Spinal cord compression due to malignant neoplasm metastatic to spine [facility]) Active Problems:   Ovarian cancer, unspecified laterality ([place])   Metastatic cancer to spine ([place])   Back pain  T6 cord compression Likely due to malignant metastases No focal neurologic deficit noted Neurosurgery on board, plan for OR today Radiation oncology will follow up after surgery No steroids per neurosurgery Frequent neuro checks, monitor closely  Metastatic ovarian cancer Hx of 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, diagnosed in April 2018 at [place] abd/pelvis/chest showed extensive mets, likely from OBGYN source CT guided biopsy done on 02/25/2018 of abdominal/pelvis mass shows metastatic recurrent granulosa cell CA Oncology on board Continue tramadol for pain control, patient has multiple allergies to stronger opiates  Hypertension/hyperlipidemia Continue triamterene-HCTZ 37.5-25 mg Continue atorvastatin 80 mg daily  Hypothyroidism Continue levothyroxine 50 mg daily  Asthma/COPD Continue inhaler  Depression/anxiety Continue sertraline 50 mg daily, PRN lorazepam     Code Status: Full  Family Communication: Daughter at bedside  Disposition Plan: To be  determined   Consultants:  Neurosurgery  Radiation oncology  Oncology  IR  Procedures:  Surgery on 02/20/2018  Antimicrobials:  None  DVT prophylaxis: Lovenox   Objective: Vitals:   02/26/18 2002 02/09/2018 0300 02/15/2018 0909 03/02/2018 1135  BP: 129/66 134/63 (!) 143/68   Pulse: 62 86 63   Resp: '17 17 18   '$ Temp: 98.8 F (37.1 C) 98.6 F (37 C) 97.9 F (36.6 C)   TempSrc: Oral Oral Oral   SpO2: 94% 93% 95%   Weight:    63.6 kg (140 lb 3.4 oz)  Height:    '5\' 5"'$  (1.651 m)   No intake or output data in the 24 hours ending 02/07/2018 1332 Filed Weights   02/24/18 1113 02/14/2018 1135  Weight: 63.6 kg (140 lb 3.4 oz) 63.6 kg (140 lb 3.4 oz)    Exam:   General: NAD  Cardiovascular: S1, S2 present  Respiratory: CTAB  Abdomen: Soft, nontender, nondistended, bowel sounds present  Musculoskeletal: No pedal edema bilaterally  Skin: Normal  Psychiatry: Normal mood   Data Reviewed: CBC: Recent Labs  Lab 03/04/2018 1657 02/25/18 0354  WBC 7.3 6.2  NEUTROABS 6.5  --   HGB 12.9 12.6  HCT 39.4 37.8  MCV 87.9 88.1  PLT 212 485   Basic Metabolic Panel: Recent Labs  Lab 02/11/2018 1657 02/25/18 0354  NA 135 136  K 3.8 3.6  CL 94* 93*  CO2 30 29  GLUCOSE 132* 110*  BUN 21 14  CREATININE 0.70 0.59  CALCIUM 11.2* 11.1*   GFR: Estimated Creatinine Clearance: 49.6 mL/min (by C-G formula based on SCr of 0.59 mg/dL). Liver Function Tests: Recent Labs  Lab 02/07/2018 1657  AST 25  ALT 28  ALKPHOS 104  BILITOT 0.9  PROT 7.2  ALBUMIN 3.5   Recent Labs  Lab 02/05/2018 1656  LIPASE 24  No results for input(s): AMMONIA in the last 168 hours. Coagulation Profile: Recent Labs  Lab 02/25/18 0919  INR 1.02   Cardiac Enzymes: Recent Labs  Lab 02/08/2018 1656  TROPONINI <0.03   BNP (last 3 results) No results for input(s): PROBNP in the last 8760 hours. HbA1C: No results for input(s): HGBA1C in the last 72 hours. CBG: No results for input(s):  GLUCAP in the last 168 hours. Lipid Profile: No results for input(s): CHOL, HDL, LDLCALC, TRIG, CHOLHDL, LDLDIRECT in the last 72 hours. Thyroid Function Tests: No results for input(s): TSH, T4TOTAL, FREET4, T3FREE, THYROIDAB in the last 72 hours. Anemia Panel: No results for input(s): VITAMINB12, FOLATE, FERRITIN, TIBC, IRON, RETICCTPCT in the last 72 hours. Urine analysis:    Component Value Date/Time   COLORURINE YELLOW 02/14/2018 1818   APPEARANCEUR CLEAR 02/04/2018 1818   LABSPEC 1.016 02/15/2018 1818   PHURINE 5.0 03/02/2018 1818   GLUCOSEU NEGATIVE 02/20/2018 1818   HGBUR NEGATIVE 02/25/2018 1818   BILIRUBINUR NEGATIVE 02/21/2018 1818   KETONESUR 5 (A) 02/22/2018 1818   PROTEINUR NEGATIVE 02/20/2018 1818   NITRITE NEGATIVE 02/18/2018 1818   LEUKOCYTESUR NEGATIVE 03/03/2018 1818   Sepsis Labs: '@LABRCNTIP'$ (procalcitonin:4,lacticidven:4)  ) Recent Results (from the past 240 hour(s))  Surgical pcr screen     Status: None   Collection Time: 02/19/2018 10:25 AM  Result Value Ref Range Status   MRSA, PCR NEGATIVE NEGATIVE Final   Staphylococcus aureus NEGATIVE NEGATIVE Final    Comment: (NOTE) The Xpert SA Assay (FDA approved for NASAL specimens in patients 41 years of age and older), is one component of a comprehensive surveillance program. It is not intended to diagnose infection nor to guide or monitor treatment. Performed at [facility], [place] [address] [place], [place] [address]       Studies: No results found.  Scheduled Meds: . [MAR Hold] atorvastatin  80 mg Oral q1800  . [MAR Hold] enoxaparin (LOVENOX) injection  40 mg Subcutaneous Q24H  . [MAR Hold] feeding supplement (ENSURE ENLIVE)  237 mL Oral BID BM  . [MAR Hold] levothyroxine  50 mcg Oral Daily  . [MAR Hold] LORazepam  1 mg Oral Q8H  . [MAR Hold] mometasone-formoterol  2 puff Inhalation BID  . [MAR Hold] sertraline  50 mg Oral Daily  . [MAR Hold] triamterene-hydrochlorothiazide  1 tablet Oral  Daily    Continuous Infusions: . ceFAZolin    . lactated ringers       LOS: 3 days     Alma Friendly, MD [facility]   If 7PM-7AM, please contact night-coverage [email or web address] Password [email or web address] 02/04/2018, 1:32 PM

## 2018-02-28 LAB — BASIC METABOLIC PANEL
Anion gap: 11 (ref 5–15)
BUN: 17 mg/dL (ref 8–23)
CHLORIDE: 92 mmol/L — AB (ref 98–111)
CO2: 30 mmol/L (ref 22–32)
CREATININE: 0.75 mg/dL (ref 0.44–1.00)
Calcium: 11.1 mg/dL — ABNORMAL HIGH (ref 8.9–10.3)
GFR calc Af Amer: >60 mL/min (ref >60)
GFR calc non Af Amer: >60 mL/min (ref >60)
Glucose, Bld: 102 mg/dL — ABNORMAL HIGH (ref 70–99)
Potassium: 4.4 mmol/L (ref 3.5–5.1)
Sodium: 133 mmol/L — ABNORMAL LOW (ref 135–145)

## 2018-02-28 LAB — CBC WITH DIFFERENTIAL/PLATELET
Abs Immature Granulocytes: 0.1 10*3/uL (ref 0.0–0.1)
Basophils Absolute: 0 10*3/uL (ref 0.0–0.1)
Basophils Relative: 0 %
EOS PCT: 0 %
Eosinophils Absolute: 0 10*3/uL (ref 0.0–0.7)
HEMATOCRIT: 38.1 % (ref 36.0–46.0)
HEMOGLOBIN: 11.9 g/dL — AB (ref 12.0–15.0)
Immature Granulocytes: 1 %
LYMPHS ABS: 0.7 10*3/uL (ref 0.7–4.0)
LYMPHS PCT: 7 %
MCH: 28.5 pg (ref 26.0–34.0)
MCHC: 31.2 g/dL (ref 30.0–36.0)
MCV: 91.1 fL (ref 78.0–100.0)
MONO ABS: 1.5 10*3/uL — AB (ref 0.1–1.0)
Monocytes Relative: 14 %
Neutro Abs: 8.6 10*3/uL — ABNORMAL HIGH (ref 1.7–7.7)
Neutrophils Relative %: 78 %
Platelets: 246 10*3/uL (ref 150–400)
RBC: 4.18 MIL/uL (ref 3.87–5.11)
RDW: 13.2 % (ref 11.5–15.5)
WBC: 11 10*3/uL — ABNORMAL HIGH (ref 4.0–10.5)

## 2018-02-28 LAB — LIPID PANEL
Cholesterol: 259 mg/dL — ABNORMAL HIGH (ref 0–200)
HDL: 35 mg/dL — ABNORMAL LOW (ref >40)
LDL CALC: 200 mg/dL — AB (ref 0–99)
Total CHOL/HDL Ratio: 7.4 RATIO
Triglycerides: 122 mg/dL (ref <150)
VLDL: 24 mg/dL (ref 0–40)

## 2018-02-28 MED ORDER — SODIUM CHLORIDE 0.9 % IV SOLN
INTRAVENOUS | Status: DC
  Administered 2018-02-28 – 2018-03-01 (×3): via INTRAVENOUS
  Administered 2018-03-02: 75 mL via INTRAVENOUS
  Administered 2018-03-03: 01:00:00 via INTRAVENOUS

## 2018-02-28 NOTE — Progress Notes (Signed)
PROGRESS NOTE  Susan Bradford TML:[id number] DOB: May 08, 1934 DOA: 03/02/2018 PCP: Merrilee Seashore, MD  HPI/Recap of past 58 hours: 84 year old with past medical history relevant for hypertension, hyperlipidemia, hypothyroidism, depression/anxiety, asthma/COPD, ovarian cancer status post resection in April 2018 who was admitted on 02/22/2018 for severe back pain and found to have widely metastatic disease (likely ovarian as primary) and T6 tumor cord compression. Pt admitted for further management.  Today, pt noted to be drowsy likely due to pain meds, but easily arousable. Unable to perform ROS   Assessment/Plan: Principal Problem:   Spinal cord compression due to malignant neoplasm metastatic to spine [facility]) Active Problems:   Ovarian cancer, unspecified laterality (HCC)   Metastatic cancer to spine (HCC)   Back pain   Ovarian cancer ([place])  T6 cord compression s/p epidural tumor resection on 02/11/2018  Likely due to tumor/malignant metastases No focal neurologic deficit noted Neurosurgery on board Radiation oncology will follow up after surgery No steroids per neurosurgery Frequent neuro checks, monitor closely  Metastatic ovarian cancer Hx of 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, diagnosed in April 2018 at [place] abd/pelvis/chest showed extensive mets, likely from OBGYN source CT guided biopsy done on 02/25/2018 of abdominal/pelvis mass shows metastatic recurrent granulosa cell CA Oncology on board Continue tramadol for pain control, patient has multiple allergies to stronger opiates  Hypertension/hyperlipidemia Continue triamterene-HCTZ 37.5-25 mg Continue atorvastatin 80 mg daily  Hypothyroidism Continue levothyroxine 50 mg daily  Asthma/COPD Continue inhaler  Depression/anxiety Continue sertraline 50 mg daily, PRN lorazepam     Code Status: Full  Family Communication: None at bedside  Disposition Plan: To be determined, once w/u  complete   Consultants:  Neurosurgery  Radiation oncology  Oncology  IR  Procedures:  Surgery on 02/22/2018  Antimicrobials:  None  DVT prophylaxis: Lovenox   Objective: Vitals:   02/28/18 0309 02/28/18 0809 02/28/18 1218 02/28/18 1600  BP: (!) 147/71 104/60 (!) 157/73 132/68  Pulse: 75 72 67 72  Resp:  20 18 18   Temp: 98.2 F (36.8 C) 97.7 F (36.5 C)  98.6 F (37 C)  TempSrc: Oral Oral  Oral  SpO2: 97% 96% 100% 99%  Weight:      Height:        Intake/Output Summary (Last 24 hours) at 02/28/2018 1628 Last data filed at 02/28/2018 1000 Gross per 24 hour  Intake 1836.54 ml  Output 1200 ml  Net 636.54 ml   Filed Weights   02/24/18 1113 03/04/2018 1135  Weight: 63.6 kg (140 lb 3.4 oz) 63.6 kg (140 lb 3.4 oz)    Exam:   General: Lethargic  Cardiovascular: S1, S2 present  Respiratory: CTAB  Abdomen: Soft, nontender, nondistended, bowel sounds present  Musculoskeletal: No pedal edema bilaterally, back incision C/D/I  Skin: Normal  Psychiatry: Unable to assess   Data Reviewed: CBC: Recent Labs  Lab 02/22/2018 1657 02/25/18 0354 02/28/18 0436  WBC 7.3 6.2 11.0*  NEUTROABS 6.5  --  8.6*  HGB 12.9 12.6 11.9*  HCT 39.4 37.8 38.1  MCV 87.9 88.1 91.1  PLT 212 254 681   Basic Metabolic Panel: Recent Labs  Lab 02/09/2018 1657 02/25/18 0354 02/28/18 0436  NA 135 136 133*  K 3.8 3.6 4.4  CL 94* 93* 92*  CO2 30 29 30   GLUCOSE 132* 110* 102*  BUN 21 14 17   CREATININE 0.70 0.59 0.75  CALCIUM 11.2* 11.1* 11.1*   GFR: Estimated Creatinine Clearance: 49.6 mL/min (by C-G formula based on  SCr of 0.75 mg/dL). Liver Function Tests: Recent Labs  Lab 02/26/2018 1657  AST 25  ALT 28  ALKPHOS 104  BILITOT 0.9  PROT 7.2  ALBUMIN 3.5   Recent Labs  Lab 02/26/2018 1656  LIPASE 24   No results for input(s): AMMONIA in the last 168 hours. Coagulation Profile: Recent Labs  Lab 02/25/18 0919  INR 1.02   Cardiac Enzymes: Recent Labs  Lab  02/10/2018 1656  TROPONINI <0.03   BNP (last 3 results) No results for input(s): PROBNP in the last 8760 hours. HbA1C: No results for input(s): HGBA1C in the last 72 hours. CBG: No results for input(s): GLUCAP in the last 168 hours. Lipid Profile: Recent Labs    02/28/18 0436  CHOL 259*  HDL 35*  LDLCALC 200*  TRIG 122  CHOLHDL 7.4   Thyroid Function Tests: No results for input(s): TSH, T4TOTAL, FREET4, T3FREE, THYROIDAB in the last 72 hours. Anemia Panel: No results for input(s): VITAMINB12, FOLATE, FERRITIN, TIBC, IRON, RETICCTPCT in the last 72 hours. Urine analysis:    Component Value Date/Time   COLORURINE YELLOW 02/18/2018 1818   APPEARANCEUR CLEAR 02/22/2018 1818   LABSPEC 1.016 02/25/2018 1818   PHURINE 5.0 02/15/2018 1818   GLUCOSEU NEGATIVE 02/25/2018 1818   HGBUR NEGATIVE 02/22/2018 1818   BILIRUBINUR NEGATIVE 02/05/2018 1818   KETONESUR 5 (A) 02/12/2018 1818   PROTEINUR NEGATIVE 02/12/2018 1818   NITRITE NEGATIVE 02/09/2018 1818   LEUKOCYTESUR NEGATIVE 02/09/2018 1818   Sepsis Labs: @LABRCNTIP (procalcitonin:4,lacticidven:4)  ) Recent Results (from the past 240 hour(s))  Surgical pcr screen     Status: None   Collection Time: 02/10/2018 10:25 AM  Result Value Ref Range Status   MRSA, PCR NEGATIVE NEGATIVE Final   Staphylococcus aureus NEGATIVE NEGATIVE Final    Comment: (NOTE) The Xpert SA Assay (FDA approved for NASAL specimens in patients 10 years of age and older), is one component of a comprehensive surveillance program. It is not intended to diagnose infection nor to guide or monitor treatment. Performed at [facility], [place] [address], [place], [place] [address]       Studies: No results found.  Scheduled Meds: . atorvastatin  80 mg Oral q1800  . docusate sodium  100 mg Oral BID  . feeding supplement (ENSURE ENLIVE)  237 mL Oral BID BM  . gabapentin  300 mg Oral TID  . levothyroxine  50 mcg Oral Daily  . loratadine  10 mg Oral  Daily  . LORazepam  1 mg Oral Q8H  . mometasone-formoterol  2 puff Inhalation BID  . sertraline  50 mg Oral Daily  . sodium chloride flush  3 mL Intravenous Q12H  . triamterene-hydrochlorothiazide  1 tablet Oral Daily    Continuous Infusions: . sodium chloride    . 0.9 % NaCl with KCl 20 mEq / L Stopped (02/28/18 0525)  . lactated ringers Stopped (02/28/18 0526)     LOS: 4 days     Alma Friendly, MD [facility]   If 7PM-7AM, please contact night-coverage [email or web address] Password [facility] 02/28/2018, 4:28 PM

## 2018-02-28 NOTE — Evaluation (Signed)
Physical Therapy Evaluation Patient Details Name: Susan Bradford MRN: [id number] DOB: 1933/12/22 Today's Date: 02/28/2018   History of Present Illness  84 year old with past medical history relevant for hypertension, hyperlipidemia, hypothyroidism, depression/anxiety, asthma/COPD, ovarian cancer status post resection in April 2018 who was admitted on 02/22/2018 for severe back pain and found to have widely metastatic disease (likely ovarian as primary) and T6 tumor cord compression. Patient now s/p Thoracic 6 to Thoracic 10 fusion with pedicle screws/posterolateral arthrodesis/tumor resection at Thoracic 8   Clinical Impression  Orders received for PT evaluation. Patient demonstrates deficits in functional mobility as indicated below. Will benefit from continued skilled PT to address deficits and maximize function. Will see as indicated and progress as tolerated.  At this time, patient requiring max to total assist for mobility. Patient with difficulty maintaining LE positioning throughout. Feel patient will needs extensive rehabilitation upon discharge, unsure that she would tolerate intensive therapies, therefor recommending SNF at this time.    Follow Up Recommendations SNF;Supervision/Assistance - 24 hour    Equipment Recommendations  (TBD may need wheel chair)    Recommendations for Other Services       Precautions / Restrictions Precautions Precautions: Back Restrictions Weight Bearing Restrictions: No      Mobility  Bed Mobility Overal bed mobility: Needs Assistance Bed Mobility: Rolling;Sidelying to Sit Rolling: Mod assist Sidelying to sit: Max assist       General bed mobility comments: VCs for initiation of rollig with log roll technique, increased physical assist to complete roll to sidelying and elevate trunk to upright position at EOB. Increased time and effort. Noted increase in pain during transitional movements  Transfers Overall transfer level: Needs  assistance Equipment used: 2 person hand held assist(face to face with chuck pad ) Transfers: Sit to/from Stand;Stand Pivot Transfers Sit to Stand: Max assist;+2 physical assistance Stand pivot transfers: Max assist;+2 physical assistance       General transfer comment: patient with poor ability to control strength in LEs, unable to support weight.   Ambulation/Gait             General Gait Details: unable to perform  Stairs            Wheelchair Mobility    Modified Rankin (Stroke Patients Only)       Balance Overall balance assessment: Needs assistance   Sitting balance-Leahy Scale: Fair Sitting balance - Comments: unable to take challenge     Standing balance-Leahy Scale: Zero Standing balance comment: unable to stand                             Pertinent Vitals/Pain Pain Assessment: Faces Faces Pain Scale: Hurts even more Pain Location: surgical site, upper spine Pain Descriptors / Indicators: Aching;Grimacing;Guarding;Sore;Operative site guarding Pain Intervention(s): Limited activity within patient's tolerance;Monitored during session;Repositioned    Home Living Family/patient expects to be discharged to:: Skilled nursing facility Living Arrangements: Alone Available Help at Discharge: Family(daughter lives in [place])                  Prior Function           Comments: does not drive     Hand Dominance        Extremity/Trunk Assessment   Upper Extremity Assessment Upper Extremity Assessment: Defer to OT evaluation    Lower Extremity Assessment Lower Extremity Assessment: RLE deficits/detail;LLE deficits/detail RLE: Unable to fully assess due to pain RLE Sensation: decreased light touch;decreased  proprioception RLE Coordination: decreased fine motor;decreased gross motor LLE: Unable to fully assess due to pain LLE Sensation: decreased light touch;decreased proprioception LLE Coordination: decreased fine  motor;decreased gross motor    Cervical / Trunk Assessment Cervical / Trunk Assessment: (s/p thoracic surgery)  Communication   Communication: HOH  Cognition Arousal/Alertness: Awake/alert;Lethargic(intially lethargic) Behavior During Therapy: Flat affect Overall Cognitive Status: Impaired/Different from baseline Area of Impairment: Orientation;Attention;Following commands;Safety/judgement;Awareness;Problem solving                 Orientation Level: Disoriented to;Place;Situation     Following Commands: Follows one step commands with increased time Safety/Judgement: Decreased awareness of safety;Decreased awareness of deficits Awareness: Emergent Problem Solving: Slow processing;Decreased initiation;Requires verbal cues;Requires tactile cues        General Comments      Exercises     Assessment/Plan    PT Assessment Patient needs continued PT services  PT Problem List Decreased strength;Decreased activity tolerance;Decreased balance;Decreased mobility;Pain;Impaired sensation;Decreased coordination;Decreased knowledge of precautions       PT Treatment Interventions DME instruction;Gait training;Functional mobility training;Therapeutic activities;Therapeutic exercise;Balance training;Neuromuscular re-education;Cognitive remediation;Patient/family education    PT Goals (Current goals can be found in the Care Plan section)  Acute Rehab PT Goals Patient Stated Goal: none stated PT Goal Formulation: With patient Time For Goal Achievement: 03/14/18 Potential to Achieve Goals: Good    Frequency Min 5X/week   Barriers to discharge        Co-evaluation PT/OT/SLP Co-Evaluation/Treatment: Yes Reason for Co-Treatment: Complexity of the patient's impairments (multi-system involvement) PT goals addressed during session: Mobility/safety with mobility OT goals addressed during session: ADL's and self-care       AM-PAC PT "6 Clicks" Daily Activity  Outcome Measure  Difficulty turning over in bed (including adjusting bedclothes, sheets and blankets)?: Unable Difficulty moving from lying on back to sitting on the side of the bed? : Unable Difficulty sitting down on and standing up from a chair with arms (e.g., wheelchair, bedside commode, etc,.)?: Unable Help needed moving to and from a bed to chair (including a wheelchair)?: A Lot Help needed walking in hospital room?: Total Help needed climbing 3-5 steps with a railing? : Total 6 Click Score: 7    End of Session Equipment Utilized During Treatment: Oxygen Activity Tolerance: No increased pain;Patient limited by fatigue Patient left: in chair;with call bell/phone within reach;with chair alarm set Nurse Communication: Mobility status PT Visit Diagnosis: Muscle weakness (generalized) (M62.81);Unsteadiness on feet (R26.81);Difficulty in walking, not elsewhere classified (R26.2);Other symptoms and signs involving the nervous system (R29.898)    Time: 6415-8309 PT Time Calculation (min) (ACUTE ONLY): 36 min   Charges:   PT Evaluation $PT Eval Moderate Complexity: 1 Mod          Alben Deeds, PT DPT  Board Certified Neurologic Specialist [phone]   Duncan Dull 02/28/2018, 9:37 AM

## 2018-02-28 NOTE — Evaluation (Addendum)
Occupational Therapy Evaluation Patient Details Name: Susan Bradford MRN: [id number] DOB: 02/22/1934 Today's Date: 02/28/2018    History of Present Illness 84 year old with past medical history relevant for hypertension, hyperlipidemia, hypothyroidism, depression/anxiety, asthma/COPD, ovarian cancer status post resection in April 2018 who was admitted on 02/22/2018 for severe back pain and found to have widely metastatic disease (likely ovarian as primary) and T6 tumor cord compression. Patient now s/p Thoracic 6 to Thoracic 10 fusion with pedicle screws/posterolateral arthrodesis/tumor resection at Thoracic 8    Clinical Impression   This 84 yo female admitted with above presents to acute OT with decreased balance, decreased mobility, increased pain, decreased strength all affecting her PLOF of living alone and totally independent. She will benefit from acute OT with follow up OT at SNF.    Follow Up Recommendations  SNF;Supervision/Assistance - 24 hour    Equipment Recommendations  None recommended by OT       Precautions / Restrictions Precautions Precautions: Back Precaution Booklet Issued: No Restrictions Weight Bearing Restrictions: No      Mobility Bed Mobility Overal bed mobility: Needs Assistance Bed Mobility: Rolling;Sidelying to Sit Rolling: Mod assist Sidelying to sit: Max assist          Transfers Overall transfer level: Needs assistance Equipment used: 2 person hand held assist(face to face with chuck) Transfers: Sit to/from Stand;Stand Pivot Transfers Sit to Stand: Max assist;+2 physical assistance Stand pivot transfers: Max assist;+2 physical assistance       General transfer comment: patient with poor ability to control strength in LEs, unable to support weight.     Balance Overall balance assessment: Needs assistance   Sitting balance-Leahy Scale: Fair Sitting balance - Comments: unable to take challenge     Standing balance-Leahy Scale:  Zero Standing balance comment: unable to stand                           ADL either performed or assessed with clinical judgement   ADL Overall ADL's : Needs assistance/impaired Eating/Feeding: Set up Eating/Feeding Details (indicate cue type and reason): supported sitting Grooming: Set up;Supervision/safety Grooming Details (indicate cue type and reason): supported sitting Upper Body Bathing: Supervision/ safety;Set up Upper Body Bathing Details (indicate cue type and reason): supported sitting Lower Body Bathing: Maximal assistance Lower Body Bathing Details (indicate cue type and reason): Max A +2 partial stand Upper Body Dressing : Maximal assistance Upper Body Dressing Details (indicate cue type and reason): supported sitting Lower Body Dressing: Total assistance Lower Body Dressing Details (indicate cue type and reason): Max A +2 partial stand Toilet Transfer: Maximal assistance;+2 for physical assistance;Squat-pivot;BSC   Toileting- Clothing Manipulation and Hygiene: Total assistance               Vision Patient Visual Report: No change from baseline              Pertinent Vitals/Pain Pain Assessment: Faces Faces Pain Scale: Hurts even more Pain Location: surgical site, upper spine Pain Descriptors / Indicators: Aching;Grimacing;Guarding;Sore;Operative site guarding Pain Intervention(s): Limited activity within patient's tolerance;Monitored during session;Repositioned     Hand Dominance Right   Extremity/Trunk Assessment Upper Extremity Assessment Upper Extremity Assessment: RUE deficits/detail;LUE deficits/detail RUE Deficits / Details: C/o pain and decreased ROM in shoulders (pta), tremors RUE Coordination: decreased gross motor LUE Deficits / Details: C/o pain and decreased ROM in shoulders (pta), tremors LUE Coordination: decreased gross motor           Communication Communication Communication:  HOH   Cognition Arousal/Alertness:  Awake/alert;Lethargic(initally lethargic) Behavior During Therapy: Flat affect Overall Cognitive Status: Impaired/Different from baseline Area of Impairment: Orientation;Attention;Following commands;Safety/judgement;Awareness;Problem solving                 Orientation Level: Disoriented to;Place;Situation     Following Commands: Follows one step commands with increased time Safety/Judgement: Decreased awareness of safety;Decreased awareness of deficits Awareness: Emergent Problem Solving: Slow processing;Decreased initiation;Requires verbal cues;Requires tactile cues                Home Living Family/patient expects to be discharged to:: Skilled nursing facility Living Arrangements: Alone                                      Prior Functioning/Environment          Comments: does not drive        OT Problem List: Decreased strength;Decreased range of motion;Impaired balance (sitting and/or standing);Decreased safety awareness;Pain;Impaired UE functional use;Decreased knowledge of use of DME or AE      OT Treatment/Interventions: Self-care/ADL training;Balance training;Neuromuscular education;DME and/or AE instruction;Patient/family education    OT Goals(Current goals can be found in the care plan section) Acute Rehab OT Goals Patient Stated Goal: none stated OT Goal Formulation: With patient Time For Goal Achievement: 03/14/18 Potential to Achieve Goals: Good  OT Frequency: Min 3X/week   Barriers to D/C: Decreased caregiver support          Co-evaluation PT/OT/SLP Co-Evaluation/Treatment: Yes Reason for Co-Treatment: Complexity of the patient's impairments (multi-system involvement)   OT goals addressed during session: ADL's and self-care;Strengthening/ROM      AM-PAC PT "6 Clicks" Daily Activity     Outcome Measure Help from another person eating meals?: A Little Help from another person taking care of personal grooming?: A  Little Help from another person toileting, which includes using toliet, bedpan, or urinal?: Total Help from another person bathing (including washing, rinsing, drying)?: A Lot Help from another person to put on and taking off regular upper body clothing?: A Lot Help from another person to put on and taking off regular lower body clothing?: Total 6 Click Score: 12   End of Session Equipment Utilized During Treatment: Gait belt  Activity Tolerance: Patient limited by fatigue Patient left: in chair;with call bell/phone within reach  OT Visit Diagnosis: Other abnormalities of gait and mobility (R26.89);Muscle weakness (generalized) (M62.81)                Time: 9357-0177 OT Time Calculation (min): 36 min Charges:  OT General Charges $OT Visit: 1 Visit OT Evaluation $OT Eval Moderate Complexity: [address], [place] [phone] 02/28/2018

## 2018-02-28 NOTE — NC FL2 (Signed)
[place] LEVEL OF CARE SCREENING TOOL     IDENTIFICATION  Patient Name: Susan Bradford Birthdate: July 29, 1934 Sex: female Admission Date (Current Location): 03/03/2018  [facility] and [place] Number:  Herbalist and Address:  [facility] [place], [place] [address] [place], [place] [address]      Provider Number: [id number]  Attending Physician Name and Address:  Alma Friendly, MD  Relative Name and Phone Number:       Current Level of Care: Hospital Recommended Level of Care: [place] Prior Approval Number:    Date Approved/Denied:   PASRR Number: [id number]  Discharge Plan: SNF    Current Diagnoses: Patient Active Problem List   Diagnosis Date Noted  . Ovarian cancer ([place]) 02/20/2018  . Ovarian cancer, unspecified laterality ([place]) 02/07/2018  . Metastatic cancer to spine ([place]) 02/21/2018  . Spinal cord compression due to malignant neoplasm metastatic to spine ([place]) 02/03/2018  . Back pain 02/03/2018  . COPD (chronic obstructive pulmonary disease) ([place]) 02/29/2016    Orientation RESPIRATION BLADDER Height & Weight     Self, Time, Situation, Place  O2(Nasal Cannula 2.5 L) Continent Weight: 140 lb 3.4 oz (63.6 kg) Height:  5\' 5"  (165.1 cm)  BEHAVIORAL SYMPTOMS/MOOD NEUROLOGICAL BOWEL NUTRITION STATUS      Incontinent Diet(Clear liquid, thin liquids, PLEASE SEE D/C SUMMARY, DIET SUBJECT TO CHANGE)  AMBULATORY STATUS COMMUNICATION OF NEEDS Skin   Extensive Assist Verbally Surgical wounds(Closed incision back, liquid adhesive)                       Personal Care Assistance Level of Assistance  Feeding, Dressing, Bathing Bathing Assistance: Maximum assistance Feeding assistance: Limited assistance Dressing Assistance: Maximum assistance     Functional Limitations Info  Sight, Hearing, Speech Sight Info: Adequate Hearing Info: Adequate Speech Info: Adequate    SPECIAL CARE FACTORS FREQUENCY  PT  (By licensed PT), OT (By licensed OT)     PT Frequency: 5X OT Frequency: 5X            Contractures Contractures Info: Not present    Additional Factors Info  Code Status, Allergies Code Status Info: Full Code Allergies Info: Oxycodone, Hydromorphone, Tape           Current Medications (02/28/2018):  This is the current hospital active medication list Current Facility-Administered Medications  Medication Dose Route Frequency Provider Last Rate Last Dose  . 0.9 %  sodium chloride infusion  250 mL Intravenous Continuous Ashok Pall, MD      . 0.9 % NaCl with KCl 20 mEq/ L  infusion   Intravenous Continuous Ashok Pall, MD   Stopped at 02/28/18 0525  . acetaminophen (TYLENOL) tablet 650 mg  650 mg Oral Q6H PRN Ashok Pall, MD   650 mg at 02/28/18 0940   Or  . acetaminophen (TYLENOL) suppository 650 mg  650 mg Rectal Q6H PRN Ashok Pall, MD      . atorvastatin (LIPITOR) tablet 80 mg  80 mg Oral q1800 Ashok Pall, MD   80 mg at 02/04/2018 2300  . bisacodyl (DULCOLAX) EC tablet 5 mg  5 mg Oral Daily PRN Ashok Pall, MD   5 mg at 03/03/2018 2300  . diazepam (VALIUM) tablet 5 mg  5 mg Oral Q6H PRN Ashok Pall, MD   5 mg at 02/28/18 0439  . docusate sodium (COLACE) capsule 100 mg  100 mg Oral BID Ashok Pall, MD   100 mg  at 02/28/18 0934  . feeding supplement (ENSURE ENLIVE) (ENSURE ENLIVE) liquid 237 mL  237 mL Oral BID BM Ashok Pall, MD   237 mL at 02/28/18 1435  . fentaNYL (SUBLIMAZE) injection 25-50 mcg  25-50 mcg Intravenous Q5 min PRN Janeece Riggers, MD   50 mcg at 02/02/2018 2318  . fentaNYL (SUBLIMAZE) injection 50-100 mcg  50-100 mcg Intravenous Q2H PRN Ashok Pall, MD   100 mcg at 02/28/18 0439  . gabapentin (NEURONTIN) capsule 300 mg  300 mg Oral TID Ashok Pall, MD   300 mg at 02/28/18 0934  . HYDROcodone-acetaminophen (NORCO/VICODIN) 5-325 MG per tablet 1-2 tablet  1-2 tablet Oral Q4H PRN Ashok Pall, MD   2 tablet at 02/28/18 1157  . lactated ringers  infusion   Intravenous Continuous Ashok Pall, MD   Stopped at 02/28/18 [phone]  . levothyroxine (SYNTHROID, LEVOTHROID) tablet 50 mcg  50 mcg Oral Daily Ashok Pall, MD   50 mcg at 02/28/18 0755  . loratadine (CLARITIN) tablet 10 mg  10 mg Oral Daily Ashok Pall, MD   10 mg at 02/28/18 0934  . LORazepam (ATIVAN) tablet 1 mg  1 mg Oral Q8H Ashok Pall, MD   1 mg at 02/28/18 1435  . magnesium citrate solution 1 Bottle  1 Bottle Oral Once PRN Ashok Pall, MD      . menthol-cetylpyridinium (CEPACOL) lozenge 3 mg  1 lozenge Oral PRN Ashok Pall, MD       Or  . phenol (CHLORASEPTIC) mouth spray 1 spray  1 spray Mouth/Throat PRN Ashok Pall, MD      . meperidine (DEMEROL) injection 6.25 mg  6.25 mg Intravenous Q5 min PRN Janeece Riggers, MD      . mometasone-formoterol [facility]) 200-5 MCG/ACT inhaler 2 puff  2 puff Inhalation BID Ashok Pall, MD   2 puff at 02/28/18 0845  . ondansetron (ZOFRAN) tablet 4 mg  4 mg Oral Q6H PRN Ashok Pall, MD       Or  . ondansetron (ZOFRAN) injection 4 mg  4 mg Intravenous Q6H PRN Ashok Pall, MD   4 mg at 02/28/18 1052  . senna-docusate (Senokot-S) tablet 1 tablet  1 tablet Oral QHS PRN Ashok Pall, MD      . sertraline (ZOLOFT) tablet 50 mg  50 mg Oral Daily Ashok Pall, MD   50 mg at 02/28/18 0934  . sodium chloride flush (NS) 0.9 % injection 3 mL  3 mL Intravenous Q12H Ashok Pall, MD   3 mL at 02/28/18 0946  . sodium chloride flush (NS) 0.9 % injection 3 mL  3 mL Intravenous PRN Ashok Pall, MD      . traMADol Veatrice Bourbon) tablet 50 mg  50 mg Oral Q6H PRN Ashok Pall, MD   50 mg at 02/26/18 1122  . triamterene-hydrochlorothiazide (MAXZIDE-25) 37.5-25 MG per tablet 1 tablet  1 tablet Oral Daily Ashok Pall, MD   1 tablet at 02/28/18 0934  . zolpidem (AMBIEN) tablet 5 mg  5 mg Oral QHS PRN Ashok Pall, MD         Discharge Medications: Please see discharge summary for a list of discharge medications.  Relevant Imaging  Results:  Relevant Lab Results:   Additional Information SSN: [id number]  Eileen Stanford, LCSW

## 2018-02-28 NOTE — Progress Notes (Signed)
Patient ID: Susan Bradford, female   DOB: Apr 03, 1934, 84 y.o.   MRN: [id number] BP (!) 157/73 (BP Location: Right Arm)   Pulse 67   Temp 97.7 F (36.5 C) (Oral)   Resp 18   Ht 5\' 5"  (1.651 m)   Wt 63.6 kg (140 lb 3.4 oz)   SpO2 100%   BMI 23.33 kg/m  Alert, oriented x4 speech is clear and fluent Moving all extremities well Wound is clean, dry, and without signs of infection Will consult rad onc. Will need a myelogram prior to radiation, hardware will produce too much artifact for MRI to be useful.

## 2018-02-28 NOTE — Clinical Social Work Note (Signed)
Clinical Social Work Assessment  Patient Details  Name: Susan Bradford MRN: [id number] Date of Birth: July 01, 1934  Date of referral:  02/28/18               Reason for consult:  Facility Placement                Permission sought to share information with:  Family Supports Permission granted to share information::  Yes, Verbal Permission Granted  Name::     Nurse, children's::     Relationship::  Daughter  Contact Information:  [phone]  Housing/Transportation Living arrangements for the past 2 months:  [place] of Information:  Patient, Adult Children Patient Interpreter Needed:  None Criminal Activity/Legal Involvement Pertinent to Current Situation/Hospitalization:  No - Comment as needed Significant Relationships:  Adult Children Lives with:  Self Do you feel safe going back to the place where you live?  No Need for family participation in patient care:  No (Coment)  Care giving concerns:  Pt is alert and oriented. Pt's daughter was present at bedside.    Social Worker assessment / plan:  CSW spoke with pt and pt's daughter at bedside. Pt's daughter states there are things to be determined between providers regarding the complexity of her moms care. Pt's daughter is not sure what level of care her mom will need. Pt's daughter was appreciative when provided a SNF list and said she will start researching the facilities. Pt has been to [facility] in the past however, pt's daughter did not appreciate the communication with the administrators there. CSW will continue to follow for a more definitive dispo plan.  Employment status:  Retired Forensic scientist:  Medicare PT Recommendations:  [place] / Referral to community resources:  [place]  Patient/Family's Response to care:  Pt verbalized understanding of CSW role and expressed appreciation for support. Pt denies any concern regarding pt care at this  time.   Patient/Family's Understanding of and Emotional Response to Diagnosis, Current Treatment, and Prognosis:  Pt understanding and realistic regarding physical limitations. Pt understands the need for SNF placement at d/c. Pt agreeable to SNF placement at d/c, at this time. Pt's responses emotionally appropriate during conversation with CSW. Pt denies any concern regarding treatment plan at this time. CSW will continue to provide support and facilitate d/c needs.   Emotional Assessment Appearance:  Appears stated age Attitude/Demeanor/Rapport:  (Patient was appropriate) Affect (typically observed):  Accepting, Appropriate Orientation:  Oriented to Place, Oriented to Self, Oriented to  Time, Oriented to Situation Alcohol / Substance use:  Not Applicable Psych involvement (Current and /or in the community):  No (Comment)  Discharge Needs  Concerns to be addressed:  Basic Needs, Care Coordination Readmission within the last 30 days:  No Current discharge risk:  Dependent with Mobility Barriers to Discharge:  Continued Medical Work up   [employer], LCSW 02/28/2018, 3:31 PM

## 2018-02-28 NOTE — Anesthesia Postprocedure Evaluation (Signed)
Anesthesia Post Note  Patient: CHERRISE OCCHIPINTI  Procedure(s) Performed: Thoracic 6 to Thoracic 10 fusion with pedicle screws/posterolateral arthrodesis/tumor resection at Thoracic 8 (N/A )     Patient location during evaluation: PACU Anesthesia Type: General Level of consciousness: awake and alert Pain management: pain level controlled Vital Signs Assessment: post-procedure vital signs reviewed and stable Respiratory status: spontaneous breathing, nonlabored ventilation, respiratory function stable and patient connected to nasal cannula oxygen Cardiovascular status: blood pressure returned to baseline and stable Postop Assessment: no apparent nausea or vomiting Anesthetic complications: no    Last Vitals:  Vitals:   02/16/2018 2309 02/28/18 0309  BP: 133/81 (!) 147/71  Pulse: 85 75  Resp: 18   Temp: 36.5 C 36.8 C  SpO2: 94% 97%    Last Pain:  Vitals:   02/28/18 0500  TempSrc:   PainSc: Asleep                 Jackelin Correia

## 2018-03-01 ENCOUNTER — Inpatient Hospital Stay (HOSPITAL_COMMUNITY): Payer: Medicare Other

## 2018-03-01 LAB — CBC WITH DIFFERENTIAL/PLATELET
Abs Immature Granulocytes: 0.1 10*3/uL (ref 0.0–0.1)
Basophils Absolute: 0.1 10*3/uL (ref 0.0–0.1)
Basophils Relative: 0 %
Eosinophils Absolute: 0 10*3/uL (ref 0.0–0.7)
Eosinophils Relative: 0 %
HCT: 34.2 % — ABNORMAL LOW (ref 36.0–46.0)
Hemoglobin: 11 g/dL — ABNORMAL LOW (ref 12.0–15.0)
Immature Granulocytes: 1 %
LYMPHS PCT: 5 %
Lymphs Abs: 0.5 10*3/uL — ABNORMAL LOW (ref 0.7–4.0)
MCH: 28.6 pg (ref 26.0–34.0)
MCHC: 32.2 g/dL (ref 30.0–36.0)
MCV: 89.1 fL (ref 78.0–100.0)
MONO ABS: 1.2 10*3/uL — AB (ref 0.1–1.0)
MONOS PCT: 11 %
NEUTROS ABS: 9.3 10*3/uL — AB (ref 1.7–7.7)
Neutrophils Relative %: 83 %
Platelets: 221 10*3/uL (ref 150–400)
RBC: 3.84 MIL/uL — ABNORMAL LOW (ref 3.87–5.11)
RDW: 13.3 % (ref 11.5–15.5)
WBC: 11.2 10*3/uL — ABNORMAL HIGH (ref 4.0–10.5)

## 2018-03-01 LAB — BASIC METABOLIC PANEL
Anion gap: 7 (ref 5–15)
BUN: 16 mg/dL (ref 8–23)
CO2: 34 mmol/L — ABNORMAL HIGH (ref 22–32)
Calcium: 10.9 mg/dL — ABNORMAL HIGH (ref 8.9–10.3)
Chloride: 90 mmol/L — ABNORMAL LOW (ref 98–111)
Creatinine, Ser: 0.88 mg/dL (ref 0.44–1.00)
GFR calc Af Amer: >60 mL/min (ref >60)
GFR calc non Af Amer: 60 mL/min — ABNORMAL LOW (ref >60)
Glucose, Bld: 130 mg/dL — ABNORMAL HIGH (ref 70–99)
POTASSIUM: 3.7 mmol/L (ref 3.5–5.1)
Sodium: 131 mmol/L — ABNORMAL LOW (ref 135–145)

## 2018-03-01 LAB — BLOOD GAS, ARTERIAL
ACID-BASE EXCESS: 8.7 mmol/L — AB (ref 0.0–2.0)
Bicarbonate: 33 mmol/L — ABNORMAL HIGH (ref 20.0–28.0)
O2 Content: 2.5 L/min
O2 Saturation: 95.7 %
PCO2 ART: 46.6 mmHg (ref 32.0–48.0)
PH ART: 7.461 — AB (ref 7.350–7.450)
PO2 ART: 74.7 mmHg — AB (ref 83.0–108.0)
Patient temperature: 97.6

## 2018-03-01 MED ORDER — GABAPENTIN 300 MG PO CAPS: 300.0000 mg | ORAL_CAPSULE | Freq: Three times a day (TID) | ORAL | Status: DC | PRN

## 2018-03-01 MED ORDER — NALOXONE HCL 0.4 MG/ML IJ SOLN
0.4000 mg | INTRAMUSCULAR | Status: DC | PRN
  Administered 2018-03-01 (×2): 0.4 mg via INTRAVENOUS
  Filled 2018-03-01 (×2): qty 1

## 2018-03-01 MED ORDER — FENTANYL CITRATE (PF) 100 MCG/2ML IJ SOLN: 12.5000 ug | Freq: Four times a day (QID) | INTRAMUSCULAR | Status: DC | PRN

## 2018-03-01 MED ORDER — BISACODYL 5 MG PO TBEC: 10.0000 mg | DELAYED_RELEASE_TABLET | Freq: Every day | ORAL | Status: DC

## 2018-03-01 MED ORDER — LORAZEPAM 1 MG PO TABS: 1.0000 mg | ORAL_TABLET | Freq: Three times a day (TID) | ORAL | Status: DC | PRN

## 2018-03-01 MED ORDER — BISACODYL 10 MG RE SUPP
10.0000 mg | Freq: Every day | RECTAL | Status: DC
  Administered 2018-03-01 – 2018-03-06 (×6): 10 mg via RECTAL
  Filled 2018-03-01 (×6): qty 1

## 2018-03-01 MED ORDER — HYDROCODONE-ACETAMINOPHEN 5-325 MG PO TABS
1.0000 | ORAL_TABLET | Freq: Four times a day (QID) | ORAL | Status: DC | PRN
  Administered 2018-03-02 – 2018-03-07 (×9): 1 via ORAL
  Filled 2018-03-01 (×9): qty 1

## 2018-03-01 MED ORDER — DIAZEPAM 2 MG PO TABS: 2.0000 mg | ORAL_TABLET | Freq: Three times a day (TID) | ORAL | Status: DC | PRN

## 2018-03-01 NOTE — Progress Notes (Signed)
Patient ID: Susan Bradford, female   DOB: 08-23-1933, 84 y.o.   MRN: [id number] Confused but awake alert  Back pain is manageable moves all extremities well  continue to mobilize with physical and occupational therapy.

## 2018-03-01 NOTE — Progress Notes (Signed)
Dr Horris Latino called per family request.  Was told to call the Neurologist on call.  Dr Saintclair Halsted office called, NP returned the call and made made aware of pt status.  Agrees with having a MRI done.  Family members made aware of the plans

## 2018-03-01 NOTE — Progress Notes (Signed)
ABGs done per orders

## 2018-03-01 NOTE — Progress Notes (Signed)
Pt is very lethargic, hard to arouse.  Grandson In the room.  Grandson concerned with pt present state

## 2018-03-01 NOTE — Progress Notes (Signed)
No change in pt status, call Dr E,  Ct scan ordered and pt gone down for test.  Daughter in the room with the pt.

## 2018-03-01 NOTE — Progress Notes (Signed)
Dr Bary Leriche called and made aware of the status.  She feels that it is due to the medications given last night.  Will continue to monitor the pt

## 2018-03-01 NOTE — Progress Notes (Signed)
PROGRESS NOTE  Susan Bradford KZS:[id number] DOB: 01-Apr-1934 DOA: 02/12/2018 PCP: Merrilee Seashore, MD  HPI/Recap of past 66 hours: 84 year old with past medical history relevant for hypertension, hyperlipidemia, hypothyroidism, depression/anxiety, asthma/COPD, ovarian cancer status post resection in April 2018 who was admitted on 02/22/2018 for severe back pain and found to have widely metastatic disease (likely ovarian as primary) and T6 tumor cord compression. Pt admitted for further management.  Today, pt noted to be very lethargic, difficult to arouse, likely due to multiple pain meds and sedatives given throughout yesterday. Pt last received ativan this am. Discussed extensively with family members who were very concerned.  2:30pm: Patient more responsive, able to open eyes and follow simple commands.  Reassured family members.   Assessment/Plan: Principal Problem:   Spinal cord compression due to malignant neoplasm metastatic to spine [facility]) Active Problems:   Ovarian cancer, unspecified laterality ([facility])   Metastatic cancer to spine ([facility])   Back pain   Ovarian cancer ([place])  Acute metabolic encephalopathy Vital signs remained stable, lab work unremarkable Likely due to oversedation secondary to multiple narcotics, sedatives given to the patient throughout yesterday Patient received frequent doses of fentanyl, Norco, scheduled doses of gabapentin, Valium, Ativan Status post 2 doses of Narcan with some slow improvements ABG showed hypoxia UA pending X-ray showed known lung mass, no pneumonia noted CT head negative for any acute intracranial abnormalities, does show old possible lacunar infarcts MRI pending Adjusted pain meds and sedatives, informed RN Monitor closely on telemetry  T6 cord compression s/p epidural tumor resection on 02/13/2018  Likely due to tumor/malignant metastases No focal neurologic deficit noted Neurosurgery on board Radiation oncology will follow up  after surgery No steroids per neurosurgery Frequent neuro checks, monitor closely  Metastatic ovarian cancer Hx of 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, diagnosed in April 2018 at [facility] abd/pelvis/chest showed extensive mets, likely from OBGYN source CT guided biopsy done on 02/25/2018 of abdominal/pelvis mass shows metastatic recurrent granulosa cell CA Oncology on board Continue tramadol for pain control, patient has multiple allergies to stronger opiates  Hypertension/hyperlipidemia Continue triamterene-HCTZ 37.5-25 mg Continue atorvastatin 80 mg daily  Hypothyroidism Continue levothyroxine 50 mg daily  Asthma/COPD Continue inhaler  Depression/anxiety Continue sertraline 50 mg daily, PRN lorazepam     Code Status: Full  Family Communication: None at bedside  Disposition Plan: To be determined, once w/u complete   Consultants:  Neurosurgery  Radiation oncology  Oncology  IR  Procedures:  Surgery on 02/06/2018  Antimicrobials:  None  DVT prophylaxis: Lovenox   Objective: Vitals:   03/01/18 0456 03/01/18 0814 03/01/18 1210 03/01/18 1413  BP: 137/69 135/86 (!) 147/69 137/76  Pulse: 92 93 95 (!) 102  Resp: 17 20 18    Temp: 97.6 F (36.4 C)  98.5 F (36.9 C)   TempSrc: Oral  Oral   SpO2: 94% 94% 90% 97%  Weight:      Height:        Intake/Output Summary (Last 24 hours) at 03/01/2018 1553 Last data filed at 03/01/2018 1500 Gross per 24 hour  Intake 368.89 ml  Output 1000 ml  Net -631.11 ml   Filed Weights   02/24/18 1113 02/26/2018 1135  Weight: 63.6 kg (140 lb 3.4 oz) 63.6 kg (140 lb 3.4 oz)    Exam:   General: Lethargic  Cardiovascular: S1, S2 present  Respiratory: CTAB  Abdomen: Soft, nontender, distended, bowel sounds present  Musculoskeletal: No pedal edema bilaterally, back incision C/D/I  Skin:  Normal  Psychiatry: Unable to assess   Data Reviewed: CBC: Recent Labs  Lab 02/21/2018 1657 02/25/18 0354  02/28/18 0436 03/01/18 0638  WBC 7.3 6.2 11.0* 11.2*  NEUTROABS 6.5  --  8.6* 9.3*  HGB 12.9 12.6 11.9* 11.0*  HCT 39.4 37.8 38.1 34.2*  MCV 87.9 88.1 91.1 89.1  PLT 212 254 246 597   Basic Metabolic Panel: Recent Labs  Lab 02/13/2018 1657 02/25/18 0354 02/28/18 0436 03/01/18 0638  NA 135 136 133* 131*  K 3.8 3.6 4.4 3.7  CL 94* 93* 92* 90*  CO2 30 29 30  34*  GLUCOSE 132* 110* 102* 130*  BUN 21 14 17 16   CREATININE 0.70 0.59 0.75 0.88  CALCIUM 11.2* 11.1* 11.1* 10.9*   GFR: Estimated Creatinine Clearance: 45.1 mL/min (by C-G formula based on SCr of 0.88 mg/dL). Liver Function Tests: Recent Labs  Lab 03/02/2018 1657  AST 25  ALT 28  ALKPHOS 104  BILITOT 0.9  PROT 7.2  ALBUMIN 3.5   Recent Labs  Lab 02/02/2018 1656  LIPASE 24   No results for input(s): AMMONIA in the last 168 hours. Coagulation Profile: Recent Labs  Lab 02/25/18 0919  INR 1.02   Cardiac Enzymes: Recent Labs  Lab 02/07/2018 1656  TROPONINI <0.03   BNP (last 3 results) No results for input(s): PROBNP in the last 8760 hours. HbA1C: No results for input(s): HGBA1C in the last 72 hours. CBG: No results for input(s): GLUCAP in the last 168 hours. Lipid Profile: Recent Labs    02/28/18 0436  CHOL 259*  HDL 35*  LDLCALC 200*  TRIG 122  CHOLHDL 7.4   Thyroid Function Tests: No results for input(s): TSH, T4TOTAL, FREET4, T3FREE, THYROIDAB in the last 72 hours. Anemia Panel: No results for input(s): VITAMINB12, FOLATE, FERRITIN, TIBC, IRON, RETICCTPCT in the last 72 hours. Urine analysis:    Component Value Date/Time   COLORURINE YELLOW 02/21/2018 1818   APPEARANCEUR CLEAR 02/11/2018 1818   LABSPEC 1.016 02/07/2018 1818   PHURINE 5.0 03/01/2018 1818   GLUCOSEU NEGATIVE 02/06/2018 1818   HGBUR NEGATIVE 03/03/2018 1818   BILIRUBINUR NEGATIVE 02/11/2018 1818   KETONESUR 5 (A) 02/05/2018 1818   PROTEINUR NEGATIVE 03/04/2018 1818   NITRITE NEGATIVE 02/26/2018 1818   LEUKOCYTESUR  NEGATIVE 02/26/2018 1818   Sepsis Labs: @LABRCNTIP (procalcitonin:4,lacticidven:4)  ) Recent Results (from the past 240 hour(s))  Surgical pcr screen     Status: None   Collection Time: 02/18/2018 10:25 AM  Result Value Ref Range Status   MRSA, PCR NEGATIVE NEGATIVE Final   Staphylococcus aureus NEGATIVE NEGATIVE Final    Comment: (NOTE) The Xpert SA Assay (FDA approved for NASAL specimens in patients 7 years of age and older), is one component of a comprehensive surveillance program. It is not intended to diagnose infection nor to guide or monitor treatment. Performed at [facility], [place] [address], [place], [place] [address]       Studies: Ct Head Wo Contrast  Result Date: 03/01/2018 CLINICAL DATA:  Altered level of consciousness. EXAM: CT HEAD WITHOUT CONTRAST TECHNIQUE: Contiguous axial images were obtained from the base of the skull through the vertex without intravenous contrast. COMPARISON:  None. FINDINGS: Brain: Moderate atrophy and white matter disease is present. Age indeterminate lacunar infarcts are present in the thalami bilaterally. No acute infarct, hemorrhage, or mass lesion is present. Ventricles are proportionate to the degree of atrophy. No significant extra-axial fluid collection is present. Vascular: Atherosclerotic calcifications are present in the cavernous internal  carotid arteries bilaterally. Skull: Calvarium is intact. No focal lytic or blastic lesions are present. No significant extracranial soft tissue injury is present. Sinuses/Orbits: A polyp or mucous retention cyst is present in the left maxillary sinus. The paranasal sinuses and mastoid air cells are clear. Globes and orbits are within normal limits. IMPRESSION: 1. Moderate atrophy and white matter disease likely reflects the sequela of chronic microvascular ischemia. 2. No acute intracranial abnormality. 3. Age indeterminate lacunar infarcts of the thalami bilaterally are likely remote. 4.  Atherosclerosis. 5. Polyp or mucous retention cyst involving the left maxillary sinus. Electronically Signed   By: San Morelle M.D.   On: 03/01/2018 12:20   Dg Chest Port 1 View  Result Date: 03/01/2018 CLINICAL DATA:  COPD, dyspnea, ovarian cancer EXAM: PORTABLE CHEST 1 VIEW COMPARISON:  02/11/2018 chest radiograph. FINDINGS: Bilateral posterior spinal fusion hardware overlies the upper thoracic spine. Stable cardiomediastinal silhouette with top-normal heart size. No pneumothorax. No pleural effusion. No pulmonary edema. Stable 3 cm circumscribed ovoid left upper lung mass. Platelike atelectasis at both lung bases, increased on the right and stable on the left. IMPRESSION: 1. Platelike bibasilar lung atelectasis, increased on the right and stable on the left. 2. Stable known left upper lung mass. Electronically Signed   By: Ilona Sorrel M.D.   On: 03/01/2018 08:02    Scheduled Meds: . atorvastatin  80 mg Oral q1800  . bisacodyl  10 mg Oral Daily  . docusate sodium  100 mg Oral BID  . feeding supplement (ENSURE ENLIVE)  237 mL Oral BID BM  . levothyroxine  50 mcg Oral Daily  . loratadine  10 mg Oral Daily  . mometasone-formoterol  2 puff Inhalation BID  . sertraline  50 mg Oral Daily  . sodium chloride flush  3 mL Intravenous Q12H  . triamterene-hydrochlorothiazide  1 tablet Oral Daily    Continuous Infusions: . sodium chloride    . sodium chloride 75 mL/hr at 03/01/18 1535  . lactated ringers Stopped (02/28/18 0526)     LOS: 5 days     Alma Friendly, MD [facility]   If 7PM-7AM, please contact night-coverage [email or web address] Password [facility] 03/01/2018, 3:53 PM

## 2018-03-02 ENCOUNTER — Inpatient Hospital Stay (HOSPITAL_COMMUNITY): Payer: Medicare Other

## 2018-03-02 ENCOUNTER — Encounter (HOSPITAL_COMMUNITY): Payer: Self-pay | Admitting: Neurosurgery

## 2018-03-02 LAB — CBC WITH DIFFERENTIAL/PLATELET
Abs Immature Granulocytes: 0.1 10*3/uL (ref 0.0–0.1)
Basophils Absolute: 0 10*3/uL (ref 0.0–0.1)
Basophils Relative: 0 %
EOS ABS: 0 10*3/uL (ref 0.0–0.7)
EOS PCT: 0 %
HEMATOCRIT: 33.2 % — AB (ref 36.0–46.0)
Hemoglobin: 10.7 g/dL — ABNORMAL LOW (ref 12.0–15.0)
IMMATURE GRANULOCYTES: 1 %
Lymphocytes Relative: 5 %
Lymphs Abs: 0.6 10*3/uL — ABNORMAL LOW (ref 0.7–4.0)
MCH: 28.7 pg (ref 26.0–34.0)
MCHC: 32.2 g/dL (ref 30.0–36.0)
MCV: 89 fL (ref 78.0–100.0)
Monocytes Absolute: 1.2 10*3/uL — ABNORMAL HIGH (ref 0.1–1.0)
Monocytes Relative: 10 %
NEUTROS PCT: 84 %
Neutro Abs: 9.8 10*3/uL — ABNORMAL HIGH (ref 1.7–7.7)
Platelets: 206 10*3/uL (ref 150–400)
RBC: 3.73 MIL/uL — AB (ref 3.87–5.11)
RDW: 13.6 % (ref 11.5–15.5)
WBC: 11.7 10*3/uL — ABNORMAL HIGH (ref 4.0–10.5)

## 2018-03-02 LAB — BASIC METABOLIC PANEL
ANION GAP: 11 (ref 5–15)
BUN: 24 mg/dL — ABNORMAL HIGH (ref 8–23)
CALCIUM: 10.8 mg/dL — AB (ref 8.9–10.3)
CHLORIDE: 91 mmol/L — AB (ref 98–111)
CO2: 31 mmol/L (ref 22–32)
Creatinine, Ser: 0.91 mg/dL (ref 0.44–1.00)
GFR calc non Af Amer: 58 mL/min — ABNORMAL LOW (ref >60)
GLUCOSE: 120 mg/dL — AB (ref 70–99)
Potassium: 3.5 mmol/L (ref 3.5–5.1)
Sodium: 133 mmol/L — ABNORMAL LOW (ref 135–145)

## 2018-03-02 MED ORDER — SENNOSIDES-DOCUSATE SODIUM 8.6-50 MG PO TABS
1.0000 | ORAL_TABLET | Freq: Two times a day (BID) | ORAL | Status: DC
  Administered 2018-03-02 – 2018-03-04 (×4): 1 via ORAL
  Filled 2018-03-02 (×4): qty 1

## 2018-03-02 MED FILL — Thrombin (Recombinant) For Soln 20000 Unit: CUTANEOUS | Qty: 1 | Status: AC

## 2018-03-02 NOTE — Evaluation (Signed)
Clinical/Bedside Swallow Evaluation Patient Details  Name: Susan Bradford MRN: [id number] Date of Birth: 02-Mar-1934  Today's Date: 03/02/2018 Time: SLP Start Time (ACUTE ONLY): 1330 SLP Stop Time (ACUTE ONLY): 1340 SLP Time Calculation (min) (ACUTE ONLY): 10 min  Past Medical History:  Past Medical History:  Diagnosis Date  . Cancer ([place])    ovarian   . High cholesterol   . Hypertension    Past Surgical History:  Past Surgical History:  Procedure Laterality Date  . ABDOMINAL HYSTERECTOMY    . ABDOMINAL SURGERY    . BACK SURGERY    . CESAREAN SECTION    . POSTERIOR LUMBAR FUSION 4 LEVEL N/A 02/28/2018   Procedure: Thoracic 6 to Thoracic 10 fusion with pedicle screws/posterolateral arthrodesis/tumor resection at Thoracic 8;  Surgeon: Ashok Pall, MD;  Location: [facility];  Service: Neurosurgery;  Laterality: N/A;  Thoracic 6 to Thoracic 10 fusion with pedicle screws/posterolateral arthrodesis/tumor resection at Thoracic 29   HPI:  84 year old with past medical history relevant for hypertension, hyperlipidemia, hypothyroidism, depression/anxiety, asthma/COPD, ovarian cancer status post resection in April 2018 who was admitted on 02/22/2018 for severe back pain and found to have widely metastatic disease (likely ovarian as primary) and T6 tumor cord compression. Patient now s/p Thoracic 6 to Thoracic 10 fusion with pedicle screws/posterolateral arthrodesis/tumor resection at Thoracic 8    Assessment / Plan / Recommendation Clinical Impression   Pt demonstrates normal swallow function, no coughing with 3 oz of thin liquids taken consecutively. Pt reports feeling that difficutly swallow has resolved. Advised her to consider basic precautions and also taking pills whole in puree if needed.  SLP Visit Diagnosis: Dysphagia, unspecified (R13.10)    Aspiration Risk  Mild aspiration risk    Diet Recommendation Regular;Thin liquid   Liquid Administration via: Cup;Straw Medication  Administration: Whole meds with puree Supervision: Patient able to self feed Compensations: Slow rate;Small sips/bites Postural Changes: Seated upright at 90 degrees    Other  Recommendations     Follow up Recommendations        Frequency and Duration            Prognosis        Swallow Study   General HPI: 84 year old with past medical history relevant for hypertension, hyperlipidemia, hypothyroidism, depression/anxiety, asthma/COPD, ovarian cancer status post resection in April 2018 who was admitted on 02/22/2018 for severe back pain and found to have widely metastatic disease (likely ovarian as primary) and T6 tumor cord compression. Patient now s/p Thoracic 6 to Thoracic 10 fusion with pedicle screws/posterolateral arthrodesis/tumor resection at Thoracic 8  Type of Study: Bedside Swallow Evaluation Diet Prior to this Study: Regular;Thin liquids Temperature Spikes Noted: No Respiratory Status: Room air History of Recent Intubation: No Behavior/Cognition: Alert;Cooperative;Pleasant mood Oral Cavity Assessment: Within Functional Limits Oral Care Completed by SLP: No Oral Cavity - Dentition: Adequate natural dentition Vision: Functional for self-feeding Self-Feeding Abilities: Able to feed self Patient Positioning: Upright in bed Baseline Vocal Quality: Normal Volitional Cough: Strong Volitional Swallow: Able to elicit    Oral/Motor/Sensory Function Overall Oral Motor/Sensory Function: Within functional limits   Ice Chips     Thin Liquid Thin Liquid: Within functional limits Presentation: Cup;Straw    Nectar Thick Nectar Thick Liquid: Not tested   Honey Thick Honey Thick Liquid: Not tested   Puree Puree: Within functional limits   Solid     Solid: Within functional limits      Fronnie Urton, Katherene Ponto 03/02/2018,4:13 PM

## 2018-03-02 NOTE — Progress Notes (Signed)
Physical Therapy Treatment Patient Details Name: Susan Bradford MRN: [id number] DOB: 1933-09-11 Today's Date: 03/02/2018    History of Present Illness 84 year old with past medical history relevant for hypertension, hyperlipidemia, hypothyroidism, depression/anxiety, asthma/COPD, ovarian cancer status post resection in April 2018 who was admitted on 02/22/2018 for severe back pain and found to have widely metastatic disease (likely ovarian as primary) and T6 tumor cord compression. Patient now s/p Thoracic 6 to Thoracic 10 fusion with pedicle screws/posterolateral arthrodesis/tumor resection at Thoracic 8     PT Comments    Patient seen for activity progression, tolerated increased time at EOB but continues to show limited functional mobility despite +2 physical assist for OOB activity. Current POC remains appropriate.  Will need SNF.  Follow Up Recommendations  SNF;Supervision/Assistance - 24 hour     Equipment Recommendations  (TBD may need wheel chair)    Recommendations for Other Services       Precautions / Restrictions Precautions Precautions: Back    Mobility  Bed Mobility Overal bed mobility: Needs Assistance Bed Mobility: Rolling;Supine to sit Rolling: Mod assist Sidelying to sit: Max assist       General bed mobility comments: Vcs for initiation of LEs to EOB, assist to come to complete sidelying with chuck pad (to left side) increased assist to elevate trunk and rotate hips to EOB(Simultaneous filing. User may not have seen previous data.)  Transfers Overall transfer level: Needs assistance Equipment used: 2 person hand held assist(face to face with chuck) Transfers: Sit to/from Stand;Stand Pivot Transfers Sit to Stand: Max assist;+2 physical assistance Stand pivot transfers: Max assist;+2 physical assistance       General transfer comment: patient with poor ability to control strength in LEs, RLE buckling in standing required knee block throughout  transfer  Ambulation/Gait             General Gait Details: unable to safely perform   Stairs             Wheelchair Mobility    Modified Rankin (Stroke Patients Only)       Balance Overall balance assessment: Needs assistance Sitting-balance support: Feet supported Sitting balance-Leahy Scale: Fair Sitting balance - Comments: unable to take challenge, with fatigue falls off to the left and posterior Postural control: Left lateral lean;Posterior lean   Standing balance-Leahy Scale: Zero Standing balance comment: unable to stand                            Cognition Arousal/Alertness: Awake/alert;Lethargic Behavior During Therapy: Flat affect Overall Cognitive Status: Impaired/Different from baseline                             Awareness: Emergent Problem Solving: Slow processing;Decreased initiation;Requires verbal cues;Requires tactile cues        Exercises      General Comments        Pertinent Vitals/Pain Pain Assessment: Faces Faces Pain Scale: Hurts little more Pain Location: surgical site, upper spine Pain Descriptors / Indicators: Aching;Grimacing;Guarding;Sore;Operative site guarding Pain Intervention(s): Limited activity within patient's tolerance;Monitored during session;Premedicated before session;Repositioned    Home Living                      Prior Function            PT Goals (current goals can now be found in the care plan section) Acute Rehab PT Goals Patient Stated  Goal: none stated PT Goal Formulation: With patient Time For Goal Achievement: 03/14/18 Potential to Achieve Goals: Good Progress towards PT goals: Progressing toward goals    Frequency    Min 5X/week      PT Plan Current plan remains appropriate    Co-evaluation PT/OT/SLP Co-Evaluation/Treatment: Yes Reason for Co-Treatment: Complexity of the patient's impairments (multi-system involvement);Necessary to address  cognition/behavior during functional activity;For patient/therapist safety;To address functional/ADL transfers PT goals addressed during session: Mobility/safety with mobility OT goals addressed during session: ADL's and self-care;Proper use of Adaptive equipment and DME;Strengthening/ROM      AM-PAC PT "6 Clicks" Daily Activity  Outcome Measure  Difficulty turning over in bed (including adjusting bedclothes, sheets and blankets)?: Unable Difficulty moving from lying on back to sitting on the side of the bed? : Unable Difficulty sitting down on and standing up from a chair with arms (e.g., wheelchair, bedside commode, etc,.)?: Unable Help needed moving to and from a bed to chair (including a wheelchair)?: A Lot Help needed walking in hospital room?: Total Help needed climbing 3-5 steps with a railing? : Total 6 Click Score: 7    End of Session Equipment Utilized During Treatment: Oxygen Activity Tolerance: No increased pain;Patient limited by fatigue Patient left: in chair;with call bell/phone within reach;with chair alarm set Nurse Communication: Mobility status PT Visit Diagnosis: Muscle weakness (generalized) (M62.81);Unsteadiness on feet (R26.81);Difficulty in walking, not elsewhere classified (R26.2);Other symptoms and signs involving the nervous system (M21.947)     Time: 0802-0826 PT Time Calculation (min) (ACUTE ONLY): 24 min  Charges:  $Therapeutic Activity: 8-22 mins                     Alben Deeds, PT DPT  Board Certified Neurologic Specialist [place] 03/02/2018, 8:43 AM

## 2018-03-02 NOTE — Progress Notes (Signed)
Occupational Therapy Treatment Patient Details Name: Susan Bradford MRN: [id number] DOB: 09-21-1933 Today's Date: 03/02/2018    History of present illness 84 year old with past medical history relevant for hypertension, hyperlipidemia, hypothyroidism, depression/anxiety, asthma/COPD, ovarian cancer status post resection in April 2018 who was admitted on 02/22/2018 for severe back pain and found to have widely metastatic disease (likely ovarian as primary) and T6 tumor cord compression. Patient now s/p Thoracic 6 to Thoracic 10 fusion with pedicle screws/posterolateral arthrodesis/tumor resection at Thoracic 8    OT comments  Pt tolerated OOB to chair this session for breakfast. Pt awake and following all commands. Pt progressing toward goals.   Follow Up Recommendations  SNF;Supervision/Assistance - 24 hour    Equipment Recommendations  None recommended by OT    Recommendations for Other Services      Precautions / Restrictions Precautions Precautions: Back       Mobility Bed Mobility Overal bed mobility: Needs Assistance Bed Mobility: Rolling;Supine to Sit Rolling: Mod assist(Simultaneous filing. User may not have seen previous data.) Sidelying to sit: Max assist   Sit to supine: Mod assist   General bed mobility comments: Vcs for initiation of LEs to EOB, assist to come to complete sidelying with chuck pad (to left side) increased assist to elevate trunk and rotate hips to EOB(Simultaneous filing. User may not have seen previous data.)  Transfers Overall transfer level: Needs assistance Equipment used: 2 person hand held assist(face to face with chuck) Transfers: Sit to/from Stand;Stand Pivot Transfers Sit to Stand: Max assist;+2 physical assistance Stand pivot transfers: Max assist;+2 physical assistance       General transfer comment: patient with poor ability to control strength in LEs, RLE buckling in standing required knee block throughout transfer    Balance  Overall balance assessment: Needs assistance Sitting-balance support: Feet supported Sitting balance-Leahy Scale: Fair Sitting balance - Comments: unable to take challenge, with fatigue falls off to the left and posterior Postural control: Left lateral lean;Posterior lean   Standing balance-Leahy Scale: Zero Standing balance comment: unable to stand                           ADL either performed or assessed with clinical judgement   ADL Overall ADL's : Needs assistance/impaired Eating/Feeding: Set up Eating/Feeding Details (indicate cue type and reason): pt sitting in chair eating jello at the end of session   Grooming Details (indicate cue type and reason): provided oral care items on tray for after eating jello. pt blowing nose without (A)                 Toilet Transfer: +2 for physical assistance;Moderate assistance Toilet Transfer Details (indicate cue type and reason): pt with R Le buckle with blocking during transfer   Toileting - Clothing Manipulation Details (indicate cue type and reason): pt with brief and reports incontinence        General ADL Comments: pt agreeable to OOB to chair      Vision       Perception     Praxis      Cognition Arousal/Alertness: Awake/alert;Lethargic Behavior During Therapy: Flat affect Overall Cognitive Status: Impaired/Different from baseline                             Awareness: Emergent Problem Solving: Slow processing;Decreased initiation;Requires verbal cues;Requires tactile cues          Exercises  Shoulder Instructions       General Comments wound open to air and not draining    Pertinent Vitals/ Pain       Pain Assessment: Faces Faces Pain Scale: Hurts little more Pain Location: surgical site, upper spine Pain Descriptors / Indicators: Aching;Grimacing;Guarding;Sore;Operative site guarding Pain Intervention(s): Limited activity within patient's tolerance;Monitored during  session;Premedicated before session;Repositioned  Home Living                                          Prior Functioning/Environment              Frequency  Min 2X/week        Progress Toward Goals  OT Goals(current goals can now be found in the care plan section)  Progress towards OT goals: Progressing toward goals  Acute Rehab OT Goals Patient Stated Goal: none stated OT Goal Formulation: With patient Time For Goal Achievement: 03/14/18 Potential to Achieve Goals: Good ADL Goals Pt Will Perform Grooming: with set-up;with supervision Pt Will Perform Upper Body Bathing: with set-up;with supervision Pt Will Perform Upper Body Dressing: with supervision;with set-up Pt Will Transfer to Toilet: with mod assist;with +2 assist;stand pivot transfer Additional ADL Goal #1: Pt will be Mod for in and OOB for basic ADLs  Plan Discharge plan remains appropriate    Co-evaluation    PT/OT/SLP Co-Evaluation/Treatment: Yes Reason for Co-Treatment: Complexity of the patient's impairments (multi-system involvement);Necessary to address cognition/behavior during functional activity;For patient/therapist safety;To address functional/ADL transfers PT goals addressed during session: Mobility/safety with mobility OT goals addressed during session: ADL's and self-care;Proper use of Adaptive equipment and DME;Strengthening/ROM      AM-PAC PT "6 Clicks" Daily Activity     Outcome Measure   Help from another person eating meals?: A Little Help from another person taking care of personal grooming?: A Little Help from another person toileting, which includes using toliet, bedpan, or urinal?: Total Help from another person bathing (including washing, rinsing, drying)?: A Lot Help from another person to put on and taking off regular upper body clothing?: A Lot Help from another person to put on and taking off regular lower body clothing?: Total 6 Click Score: 12    End of  Session    OT Visit Diagnosis: Other abnormalities of gait and mobility (R26.89);Muscle weakness (generalized) (M62.81)   Activity Tolerance Patient tolerated treatment well   Patient Left in chair;with call bell/phone within reach;with family/visitor present   Nurse Communication Mobility status;Precautions        Time: [phone] OT Time Calculation (min): 23 min  Charges: OT General Charges $OT Visit: 1 Visit OT Treatments $Self Care/Home Management : 8-22 mins   Jeri Modena   OTR/L Pager: [phone] Office: [phone] .    Parke Poisson B 03/02/2018, 8:47 AM

## 2018-03-02 NOTE — Care Management Note (Signed)
Case Management Note  Patient Details  Name: Susan Bradford MRN: [id number] Date of Birth: [birth year]  Subjective/Objective:     Pt s/p surgery for spinal cord compression d/t tumor. She is from home alone.                Action/Plan: PT/OT recommending SNF. CM following for d/c disposition.   Expected Discharge Date:                  Expected Discharge Plan:  Skilled Nursing Facility  In-House Referral:  Clinical Social Work  Discharge planning Services     Post Acute Care Choice:    Choice offered to:     DME Arranged:    DME Agency:     HH Arranged:    [place] Agency:     Status of Service:  In process, will continue to follow  If discussed at Long Length of Stay Meetings, dates discussed:    Additional Comments:  Pollie Friar, RN 03/02/2018, 11:11 AM

## 2018-03-02 NOTE — Progress Notes (Signed)
PROGRESS NOTE  ORLENA GARMON Bradford:[id number] DOB: 1934/07/03 DOA: 03/03/2018 PCP: Merrilee Seashore, MD  HPI/Recap of past 36 hours: 84 year old with past medical history relevant for hypertension, hyperlipidemia, hypothyroidism, depression/anxiety, asthma/COPD, ovarian cancer status post resection in April 2018 who was admitted on 02/22/2018 for severe back pain and found to have widely metastatic disease (likely ovarian as primary) and T6 tumor cord compression. Pt admitted for further management.  Today, pt noted to be more awake, still lethargic. Able to identify her daughter by name. Having some visual hallucination and aware of it. Denies any new complaints   Assessment/Plan: Principal Problem:   Spinal cord compression due to malignant neoplasm metastatic to spine [facility]) Active Problems:   Ovarian cancer, unspecified laterality ([place])   Metastatic cancer to spine ([facility])   Back pain   Ovarian cancer ([place])  Acute metabolic encephalopathy Vital signs remained stable, lab work unremarkable Likely due to oversedation secondary to multiple narcotics, sedatives given to the patient on 02/28/18 Patient received frequent doses of fentanyl, Norco, scheduled doses of gabapentin, Valium, Ativan Status post 2 doses of Narcan with some slow improvements ABG showed hypoxia UA negative, repeat pending X-ray showed known lung mass, no pneumonia noted CT head negative for any acute intracranial abnormalities, does show old possible lacunar infarcts MRI no acute intracranial abnormalities  Adjusted pain meds and sedatives, informed RN Monitor closely on telemetry  T6 cord compression s/p epidural tumor resection on 02/21/2018  Likely due to tumor/malignant metastases No focal neurologic deficit noted Neurosurgery on board Radiation oncology will follow up after surgery No steroids per neurosurgery Frequent neuro checks, monitor closely  Metastatic ovarian cancer Hx of stage 1C granulosa  cell tumor of the L ovary, diagnosed in April 2018 at [facility] abd/pelvis/chest showed extensive mets, likely from OBGYN source CT guided biopsy done on 02/25/2018 of abdominal/pelvis mass shows metastatic recurrent granulosa cell CA Oncology on board Continue tramadol for pain control, patient has multiple allergies to stronger opiates  Hypertension/hyperlipidemia Continue triamterene-HCTZ 37.5-25 mg Continue atorvastatin 80 mg daily  Hypothyroidism Continue levothyroxine 50 mg daily  Asthma/COPD Continue inhaler  Depression/anxiety Continue sertraline 50 mg daily, PRN lorazepam     Code Status: Full  Family Communication: Daughter at bedside  Disposition Plan: SNF   Consultants:  Neurosurgery  Radiation oncology  Oncology  IR  Procedures:  Surgery on 02/21/2018  Antimicrobials:  None  DVT prophylaxis: Lovenox   Objective: Vitals:   03/02/18 0348 03/02/18 0839 03/02/18 1212 03/02/18 1646  BP: (!) 144/75 133/68 129/67 127/65  Pulse: (!) 103 (!) 105 95 98  Resp: 18 16 16 16   Temp: 98.4 F (36.9 C) (!) 97.5 F (36.4 C) 98.2 F (36.8 C) 98.7 F (37.1 C)  TempSrc: Oral Oral Oral Oral  SpO2: 95% 95% 94% 95%  Weight:      Height:        Intake/Output Summary (Last 24 hours) at 03/02/2018 1707 Last data filed at 03/02/2018 0841 Gross per 24 hour  Intake 240 ml  Output 200 ml  Net 40 ml   Filed Weights   02/24/18 1113 03/03/2018 1135  Weight: 63.6 kg (140 lb 3.4 oz) 63.6 kg (140 lb 3.4 oz)    Exam:   General: Lethargic  Cardiovascular: S1, S2 present  Respiratory: CTAB  Abdomen: Soft, nontender, mildly distended, bowel sounds present  Musculoskeletal: No pedal edema bilaterally, back incision C/D/I  Skin: Normal  Psychiatry: Unable to assess   Data Reviewed: CBC: Recent Labs  Lab  02/25/18 0354 02/28/18 0436 03/01/18 0638 03/02/18 0644  WBC 6.2 11.0* 11.2* 11.7*  NEUTROABS  --  8.6* 9.3* 9.8*  HGB 12.6 11.9* 11.0*  10.7*  HCT 37.8 38.1 34.2* 33.2*  MCV 88.1 91.1 89.1 89.0  PLT 254 246 221 962   Basic Metabolic Panel: Recent Labs  Lab 02/25/18 0354 02/28/18 0436 03/01/18 0638 03/02/18 0644  NA 136 133* 131* 133*  K 3.6 4.4 3.7 3.5  CL 93* 92* 90* 91*  CO2 29 30 34* 31  GLUCOSE 110* 102* 130* 120*  BUN 14 17 16  24*  CREATININE 0.59 0.75 0.88 0.91  CALCIUM 11.1* 11.1* 10.9* 10.8*   GFR: Estimated Creatinine Clearance: 43.6 mL/min (by C-G formula based on SCr of 0.91 mg/dL). Liver Function Tests: No results for input(s): AST, ALT, ALKPHOS, BILITOT, PROT, ALBUMIN in the last 168 hours. No results for input(s): LIPASE, AMYLASE in the last 168 hours. No results for input(s): AMMONIA in the last 168 hours. Coagulation Profile: Recent Labs  Lab 02/25/18 0919  INR 1.02   Cardiac Enzymes: No results for input(s): CKTOTAL, CKMB, CKMBINDEX, TROPONINI in the last 168 hours. BNP (last 3 results) No results for input(s): PROBNP in the last 8760 hours. HbA1C: No results for input(s): HGBA1C in the last 72 hours. CBG: No results for input(s): GLUCAP in the last 168 hours. Lipid Profile: Recent Labs    02/28/18 0436  CHOL 259*  HDL 35*  LDLCALC 200*  TRIG 122  CHOLHDL 7.4   Thyroid Function Tests: No results for input(s): TSH, T4TOTAL, FREET4, T3FREE, THYROIDAB in the last 72 hours. Anemia Panel: No results for input(s): VITAMINB12, FOLATE, FERRITIN, TIBC, IRON, RETICCTPCT in the last 72 hours. Urine analysis:    Component Value Date/Time   COLORURINE YELLOW 02/08/2018 1818   APPEARANCEUR CLEAR 03/04/2018 1818   LABSPEC 1.016 03/01/2018 1818   PHURINE 5.0 02/22/2018 1818   GLUCOSEU NEGATIVE 02/20/2018 1818   HGBUR NEGATIVE 02/28/2018 1818   BILIRUBINUR NEGATIVE 02/17/2018 1818   KETONESUR 5 (A) 02/03/2018 1818   PROTEINUR NEGATIVE 02/25/2018 1818   NITRITE NEGATIVE 02/06/2018 1818   LEUKOCYTESUR NEGATIVE 02/28/2018 1818   Sepsis  Labs: @LABRCNTIP (procalcitonin:4,lacticidven:4)  ) Recent Results (from the past 240 hour(s))  Surgical pcr screen     Status: None   Collection Time: 02/22/2018 10:25 AM  Result Value Ref Range Status   MRSA, PCR NEGATIVE NEGATIVE Final   Staphylococcus aureus NEGATIVE NEGATIVE Final    Comment: (NOTE) The Xpert SA Assay (FDA approved for NASAL specimens in patients 72 years of age and older), is one component of a comprehensive surveillance program. It is not intended to diagnose infection nor to guide or monitor treatment. Performed at [facility], [place] [address] [place], [place] [address]       Studies: Mr Brain Wo Contrast  Result Date: 03/02/2018 CLINICAL DATA:  Altered level of consciousness.  Metastatic disease EXAM: MRI HEAD WITHOUT CONTRAST TECHNIQUE: Multiplanar, multiecho pulse sequences of the brain and surrounding structures were obtained without intravenous contrast. COMPARISON:  CT head 03/01/2018 FINDINGS: Brain: Mild atrophy. Negative for acute infarct. Chronic white matter changes are present bilaterally likely due to microvascular ischemia. Negative for hemorrhage mass or edema. No evidence of metastatic disease on unenhanced images. Vascular: Normal arterial flow voids Skull and upper cervical spine: Negative Sinuses/Orbits: Retention cyst left maxillary sinus. Remaining sinuses clear. Normal orbit Other: None IMPRESSION: Atrophy and moderate chronic microvascular ischemia in the white matter. No acute abnormality. Electronically Signed  By: Franchot Gallo M.D.   On: 03/02/2018 11:45   Dg Abd Portable 2v  Result Date: 03/01/2018 CLINICAL DATA:  Abdominal distension. EXAM: PORTABLE ABDOMEN - 2 VIEW COMPARISON:  None. FINDINGS: Moderate fecal loading in the colon. No evidence of bowel obstruction. No free air, portal venous gas, or pneumatosis identified. IMPRESSION: Moderate fecal loading in the colon. Stable left lung mass better seen on chest x-rays.  Electronically Signed   By: Dorise Bullion III M.D   On: 03/01/2018 17:43    Scheduled Meds: . atorvastatin  80 mg Oral q1800  . bisacodyl  10 mg Rectal Daily  . docusate sodium  100 mg Oral BID  . feeding supplement (ENSURE ENLIVE)  237 mL Oral BID BM  . levothyroxine  50 mcg Oral Daily  . loratadine  10 mg Oral Daily  . mometasone-formoterol  2 puff Inhalation BID  . sertraline  50 mg Oral Daily  . sodium chloride flush  3 mL Intravenous Q12H  . triamterene-hydrochlorothiazide  1 tablet Oral Daily    Continuous Infusions: . sodium chloride    . sodium chloride 75 mL (03/02/18 0821)  . lactated ringers Stopped (02/28/18 0526)     LOS: 6 days     Alma Friendly, MD [facility]   If 7PM-7AM, please contact night-coverage [email or web address] Password [email or web address] 03/02/2018, 5:07 PM

## 2018-03-02 NOTE — Progress Notes (Signed)
Patient ID: Susan Bradford, female   DOB: 05/04/1934, 84 y.o.   MRN: [id number] BP (!) 142/73 (BP Location: Right Arm)   Pulse 92   Temp 99.3 F (37.4 C) (Oral)   Resp 18   Ht 5\' 5"  (1.651 m)   Wt 63.6 kg (140 lb 3.4 oz)   SpO2 94%   BMI 23.33 kg/m  Alert and oriented x 4, speech is clear, and fluent Moving all extremities well Wound is clean, dry, no signs of infection Doing well MRI negative.

## 2018-03-02 NOTE — Progress Notes (Signed)
Patient complaining of scratching her butt when she felt like the butt was stuck to the bed pad. RN assessed and there was a little skin peel on the lt side of her butt. Barrier cream applied and sacral foam dressing applied as well.

## 2018-03-03 ENCOUNTER — Other Ambulatory Visit: Payer: Medicare Other

## 2018-03-03 ENCOUNTER — Telehealth: Payer: Self-pay | Admitting: Oncology

## 2018-03-03 DIAGNOSIS — K5909 Other constipation: Secondary | ICD-10-CM

## 2018-03-03 DIAGNOSIS — C786 Secondary malignant neoplasm of retroperitoneum and peritoneum: Secondary | ICD-10-CM

## 2018-03-03 DIAGNOSIS — R634 Abnormal weight loss: Secondary | ICD-10-CM

## 2018-03-03 LAB — CBC WITH DIFFERENTIAL/PLATELET
ABS IMMATURE GRANULOCYTES: 0.1 10*3/uL (ref 0.0–0.1)
BASOS ABS: 0 10*3/uL (ref 0.0–0.1)
BASOS PCT: 0 %
Eosinophils Absolute: 0.1 10*3/uL (ref 0.0–0.7)
Eosinophils Relative: 1 %
HCT: 27.8 % — ABNORMAL LOW (ref 36.0–46.0)
Hemoglobin: 9.3 g/dL — ABNORMAL LOW (ref 12.0–15.0)
Immature Granulocytes: 1 %
Lymphocytes Relative: 6 %
Lymphs Abs: 0.5 10*3/uL — ABNORMAL LOW (ref 0.7–4.0)
MCH: 29.8 pg (ref 26.0–34.0)
MCHC: 33.5 g/dL (ref 30.0–36.0)
MCV: 89.1 fL (ref 78.0–100.0)
Monocytes Absolute: 1.2 10*3/uL — ABNORMAL HIGH (ref 0.1–1.0)
Monocytes Relative: 14 %
NEUTROS ABS: 6.5 10*3/uL (ref 1.7–7.7)
NEUTROS PCT: 78 %
PLATELETS: 189 10*3/uL (ref 150–400)
RBC: 3.12 MIL/uL — ABNORMAL LOW (ref 3.87–5.11)
RDW: 13.4 % (ref 11.5–15.5)
WBC: 8.3 10*3/uL (ref 4.0–10.5)

## 2018-03-03 LAB — URINALYSIS, ROUTINE W REFLEX MICROSCOPIC
Bilirubin Urine: NEGATIVE
Glucose, UA: NEGATIVE mg/dL
KETONES UR: NEGATIVE mg/dL
Leukocytes, UA: NEGATIVE
Nitrite: NEGATIVE
PH: 5 (ref 5.0–8.0)
Protein, ur: NEGATIVE mg/dL
Specific Gravity, Urine: 1.018 (ref 1.005–1.030)

## 2018-03-03 LAB — BASIC METABOLIC PANEL
ANION GAP: 7 (ref 5–15)
BUN: 27 mg/dL — AB (ref 8–23)
CO2: 31 mmol/L (ref 22–32)
Calcium: 10.2 mg/dL (ref 8.9–10.3)
Chloride: 93 mmol/L — ABNORMAL LOW (ref 98–111)
Creatinine, Ser: 0.77 mg/dL (ref 0.44–1.00)
GFR calc Af Amer: >60 mL/min (ref >60)
GFR calc non Af Amer: >60 mL/min (ref >60)
Glucose, Bld: 128 mg/dL — ABNORMAL HIGH (ref 70–99)
POTASSIUM: 3 mmol/L — AB (ref 3.5–5.1)
SODIUM: 131 mmol/L — AB (ref 135–145)

## 2018-03-03 MED ORDER — SODIUM CHLORIDE 0.9 % IV SOLN
INTRAVENOUS | Status: AC
  Administered 2018-03-03: 75 mL via INTRAVENOUS
  Administered 2018-03-04: 04:00:00 via INTRAVENOUS

## 2018-03-03 MED ORDER — SODIUM CHLORIDE 0.9 % IV SOLN: INTRAVENOUS | Status: DC

## 2018-03-03 MED ORDER — POTASSIUM CHLORIDE CRYS ER 20 MEQ PO TBCR
40.0000 meq | EXTENDED_RELEASE_TABLET | Freq: Once | ORAL | Status: AC
  Administered 2018-03-03: 40 meq via ORAL
  Filled 2018-03-03: qty 2

## 2018-03-03 NOTE — Consult Note (Signed)
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 Her performance status is poor and response rates with systemic chemotherapy for this kind of condition are typically not robust Side effects including nausea, hair loss, peripheral neuropathy and other side effects are expected and discussed briefly Her condition is weak right now and it is not clear whether she can improve even with aggressive supportive care including physical therapy and rehabilitation to the point that she may benefit from chemotherapy in the future I told the patient and family members she has stage IV disease and palliative chemotherapy is not curable We have frank discussion about poor prognosis in general I did not go into great detail about the role of palliative radiation treatment, expected benefits of side effects She is undecided at the end of the visit.  I recommend palliative care consult for discussion about role of palliative care/hospice  Severe cancer pain The patient is very sensitive to side effects of pain medicine Recommend palliative care consult for assistance in pain management  Anorexia, weight loss Likely due to untreated cancer She will continue nutritional support as tolerated  Chronic constipation Likely due to untreated cancer She will continue laxative as tolerated  CODE STATUS and goals of care discussion  She is currently full code status We discussed in length, due to her terminal illness, we should consider formal advanced directives, appointment of her daughter as her dedicated healthcare power of attorney and to re-consider the role of CPR/aggressive supportive measure,  hemodialysis in the event of renal failure or the role of feeding tube if she is unable to eat  Discharge planning She may benefit from short-term outpatient rehab after surgery, pending further discussion about goals of care I have not made a return appointment for the patient to see me in the outpatient but her daughter has my business card and telephone number to call if questions arise  All questions were answered. The patient knows to call the clinic with any problems, questions or concerns.  Heath Lark, MD 03/03/2018 4:55 PM   CHIEF COMPLAINTS/PURPOSE OF CONSULTATION:  Recurrent granulosa cell tumor with metastatic disease to the spine and peritoneum, likely lung metastasis as well  HISTORY OF PRESENTING ILLNESS:  Susan Bradford 84 y.o. female is seen at the request of hospitalist and GYN oncology service Her daughter and her best friend were present in the room  The patient is able to recall some of the history but not all of it especially in regards to what happened in the last few weeks I have reviewed her chart and materials related to her cancer extensively and collaborated history with the patient.  She was originally diagnosed with granulosa cell tumor after incidental finding from imaging study.  The patient had an accident at the time and was hospitalized at [facility].  On 11/05/2016, she underwent surgery with exploratory laparotomy, radical resection, bilateral salpingo-oophorectomy, partial omentectomy, lymphadenectomy and cystoscopy.  Pathology report revealed significant involvement with granulosa cell tumor of the left ovary. The tumor was actually sent to [facility] for second opinion.  According to the expert opinion, the cells are somewhat spindled imparting the appearance of so-called sarcomatoid granulosa cell tumor.  These features have  been associated with a worse prognosis. The patient was being monitored by her GYN  oncologist after surgery. Her last appointment was on May 06, 2017.  At that time, her GYN oncologist suggested the possibility of repeat CT imaging along with surveillance follow-up in 3 months. She could not remember why she stopped seeing her GYN oncologist.  In January, she was found to have atypical ductal hyperplasia requiring breast surgery but that got canceled because she was not feeling well.  It was never rescheduled.  Over the last few weeks, she started to have progressive, worsening back pain.  She also noted some abdominal bloating and loss of appetite with unspecified weight loss.  She was seen by her primary care doctor who according to her order some imaging study and noted to have abnormal lung nodules.  Her primary doctor has plan to order CT imaging but that was not done in an expedited fashion.  Unfortunately, with worsening pain, she presented to the emergency department for evaluation.  Repeat imaging study showed T6 tumor with cord compression.  Incidentally, multiple lung nodules are also present.  CT scan of the abdomen showed significant cancer relapse with peritoneal disease.  On 02/06/2018, she underwent epidural tumor resection along with arthrodesis with allograft and screw fixation. Pathology report confirmed metastatic, recurrent granulosa cell tumor On 02/25/2018, she also underwent CT-guided biopsy of the omentum which also confirmed recurrent metastatic disease in her pelvis  Postoperatively, she complained of persistent, severe pain.  She developed acute confusion and altered mental status.  She had CT imaging of the head followed by MRI of the brain which rule out intracranial disease.  Today, she is more alert but complained of severe pain.  She is afraid to take more pain medicine because of excessive side effects from treatment.  She had recent constipation but resolved with laxatives.  She denies nausea.  She denies significant neurological deficit  postoperatively.  She felt weak overall but has preserved sensation.  She had pre-existing peripheral neuropathy affecting her left thigh.  MEDICAL HISTORY:  Past Medical History:  Diagnosis Date  . Cancer ([place])    ovarian   . High cholesterol   . Hypertension     SURGICAL HISTORY: Past Surgical History:  Procedure Laterality Date  . ABDOMINAL HYSTERECTOMY    . ABDOMINAL SURGERY    . BACK SURGERY    . CESAREAN SECTION    . POSTERIOR LUMBAR FUSION 4 LEVEL N/A 02/26/2018   Procedure: Thoracic 6 to Thoracic 10 fusion with pedicle screws/posterolateral arthrodesis/tumor resection at Thoracic 8;  Surgeon: Ashok Pall, MD;  Location: [facility];  Service: Neurosurgery;  Laterality: N/A;  Thoracic 6 to Thoracic 10 fusion with pedicle screws/posterolateral arthrodesis/tumor resection at Thoracic 8    SOCIAL HISTORY: Social History   Socioeconomic History  . Marital status: Widowed    Spouse name: Not on file  . Number of children: Not on file  . Years of education: Not on file  . Highest education level: Not on file  Occupational History  . Not on file  Social Needs  . Financial resource strain: Not on file  . Food insecurity:    Worry: Not on file    Inability: Not on file  . Transportation needs:    Medical: Not on file    Non-medical: Not on file  Tobacco Use  . Smoking status: Former Smoker    Packs/day: 1.00    Years: 50.00    Pack years: 50.00  Types: Cigarettes    Last attempt to quit: 02/28/2001    Years since quitting: 17.0  . Smokeless tobacco: Never Used  Substance and Sexual Activity  . Alcohol use: Yes    Comment: occasionally  . Drug use: No  . Sexual activity: Not Currently  Lifestyle  . Physical activity:    Days per week: Not on file    Minutes per session: Not on file  . Stress: Not on file  Relationships  . Social connections:    Talks on phone: Not on file    Gets together: Not on file    Attends religious service: Not on file    Active member  of club or organization: Not on file    Attends meetings of clubs or organizations: Not on file    Relationship status: Not on file  . Intimate partner violence:    Fear of current or ex partner: Not on file    Emotionally abused: Not on file    Physically abused: Not on file    Forced sexual activity: Not on file  Other Topics Concern  . Not on file  Social History Narrative  . Not on file    FAMILY HISTORY: Family History  Problem Relation Age of Onset  . Cancer Sister   . Cancer Brother     ALLERGIES:  is allergic to oxycodone; hydromorphone; and tape.  MEDICATIONS:  Current Facility-Administered Medications  Medication Dose Route Frequency Provider Last Rate Last Dose  . 0.9 %  sodium chloride infusion  250 mL Intravenous Continuous Cabbell, Marylyn Ishihara, MD      . 0.9 %  sodium chloride infusion   Intravenous Continuous Susan Friendly, MD 75 mL/hr at 03/03/18 1230 75 mL at 03/03/18 1230  . acetaminophen (TYLENOL) tablet 650 mg  650 mg Oral Q6H PRN Ashok Pall, MD   650 mg at 03/02/18 1335   Or  . acetaminophen (TYLENOL) suppository 650 mg  650 mg Rectal Q6H PRN Ashok Pall, MD      . atorvastatin (LIPITOR) tablet 80 mg  80 mg Oral q1800 Ashok Pall, MD   80 mg at 03/02/18 2141  . bisacodyl (DULCOLAX) suppository 10 mg  10 mg Rectal Daily Susan Friendly, MD   10 mg at 03/03/18 1000  . feeding supplement (ENSURE ENLIVE) (ENSURE ENLIVE) liquid 237 mL  237 mL Oral BID BM Ashok Pall, MD   237 mL at 03/03/18 1000  . HYDROcodone-acetaminophen (NORCO/VICODIN) 5-325 MG per tablet 1 tablet  1 tablet Oral Q6H PRN Susan Friendly, MD   1 tablet at 03/03/18 1014  . lactated ringers infusion   Intravenous Continuous Ashok Pall, MD   Stopped at 02/28/18 [phone]  . levothyroxine (SYNTHROID, LEVOTHROID) tablet 50 mcg  50 mcg Oral Daily Ashok Pall, MD   50 mcg at 03/03/18 0800  . LORazepam (ATIVAN) tablet 1 mg  1 mg Oral Q8H PRN Susan Friendly, MD      . magnesium  citrate solution 1 Bottle  1 Bottle Oral Once PRN Ashok Pall, MD      . mometasone-formoterol (DULERA) 200-5 MCG/ACT inhaler 2 puff  2 puff Inhalation BID Ashok Pall, MD   2 puff at 03/03/18 0840  . naloxone [facility]) injection 0.4 mg  0.4 mg Intravenous PRN Susan Friendly, MD   0.4 mg at 03/01/18 1254  . ondansetron (ZOFRAN) tablet 4 mg  4 mg Oral Q6H PRN Ashok Pall, MD       Or  .  ondansetron (ZOFRAN) injection 4 mg  4 mg Intravenous Q6H PRN Ashok Pall, MD   4 mg at 02/28/18 1052  . senna-docusate (Senokot-S) tablet 1 tablet  1 tablet Oral BID Susan Friendly, MD   1 tablet at 03/03/18 1015  . sertraline (ZOLOFT) tablet 50 mg  50 mg Oral Daily Ashok Pall, MD   50 mg at 03/03/18 1014  . sodium chloride flush (NS) 0.9 % injection 3 mL  3 mL Intravenous Q12H Ashok Pall, MD   3 mL at 03/02/18 2145  . sodium chloride flush (NS) 0.9 % injection 3 mL  3 mL Intravenous PRN Ashok Pall, MD   3 mL at 03/02/18 2145  . traMADol (ULTRAM) tablet 50 mg  50 mg Oral Q6H PRN Ashok Pall, MD   50 mg at 03/03/18 1235  . triamterene-hydrochlorothiazide (MAXZIDE-25) 37.5-25 MG per tablet 1 tablet  1 tablet Oral Daily Ashok Pall, MD   1 tablet at 03/03/18 1014  . zolpidem (AMBIEN) tablet 5 mg  5 mg Oral QHS PRN Ashok Pall, MD        REVIEW OF SYSTEMS:   Constitutional: Denies fevers, chills or abnormal night sweats Eyes: Denies blurriness of vision, double vision or watery eyes Ears, nose, mouth, throat, and face: Denies mucositis or sore throat Respiratory: Denies cough, dyspnea or wheezes Cardiovascular: Denies palpitation, chest discomfort or lower extremity swelling Skin: Denies abnormal skin rashes Lymphatics: Denies new lymphadenopathy or easy bruising Behavioral/Psych: Mood is stable, no new changes  All other systems were reviewed with the patient and are negative.  PHYSICAL EXAMINATION: ECOG PERFORMANCE STATUS: 3 - Symptomatic, >50% confined to bed  Vitals:    03/03/18 0840 03/03/18 1254  BP:  111/77  Pulse: 78 (!) 104  Resp: 18 17  Temp:  100.2 F (37.9 C)  SpO2: 91% 95%   Filed Weights   02/24/18 1113 02/26/2018 1135  Weight: 140 lb 3.4 oz (63.6 kg) 140 lb 3.4 oz (63.6 kg)    GENERAL:alert, no distress and comfortable. She looks pale SKIN: skin color, texture, turgor are normal, no rashes or significant lesions EYES: normal, conjunctiva are pink and non-injected, sclera clear OROPHARYNX:no exudate, no erythema and lips, buccal mucosa, and tongue normal  NECK: supple, thyroid normal size, non-tender, without nodularity LYMPH:  no palpable lymphadenopathy in the cervical, axillary or inguinal LUNGS: clear to auscultation and percussion with normal breathing effort HEART: regular rate & rhythm and no murmurs and no lower extremity edema ABDOMEN:abdomen soft, distended with palpable mass Musculoskeletal:no cyanosis of digits and no clubbing  PSYCH: alert & oriented x 3 with fluent speech NEURO: Unable to examine fully but she appears to have preserved sensation throughout and mild weakness on both lower extremities  LABORATORY DATA:  I have reviewed the data as listed Lab Results  Component Value Date   WBC 8.3 03/03/2018   HGB 9.3 (L) 03/03/2018   HCT 27.8 (L) 03/03/2018   MCV 89.1 03/03/2018   PLT 189 03/03/2018   Recent Labs    02/07/2018 1657  03/01/18 0638 03/02/18 0644 03/03/18 0503  NA 135   < > 131* 133* 131*  K 3.8   < > 3.7 3.5 3.0*  CL 94*   < > 90* 91* 93*  CO2 30   < > 34* 31 31  GLUCOSE 132*   < > 130* 120* 128*  BUN 21   < > 16 24* 27*  CREATININE 0.70   < > 0.88 0.91 0.77  CALCIUM  11.2*   < > 10.9* 10.8* 10.2  GFRNONAA >60   < > 60* 58* >60  GFRAA >60   < > >60 >60 >60  PROT 7.2  --   --   --   --   ALBUMIN 3.5  --   --   --   --   AST 25  --   --   --   --   ALT 28  --   --   --   --   ALKPHOS 104  --   --   --   --   BILITOT 0.9  --   --   --   --    < > = values in this interval not displayed.     RADIOGRAPHIC STUDIES: I have personally reviewed the radiological images as listed and agreed with the findings in the report. Dg Chest 2 View  Result Date: 02/22/2018 CLINICAL DATA:  mid-upper back pain that started Feb last year but got worse today. Pt went to Chiropractor last week and had her back re-aligned. Feels he may have been to hard and today she slept in later than her usual so dont know if that is what caused her back to hurt worse. Also c/o nausea. H/o ovarian cancer and HTN. Former smoker. EXAM: CHEST - 2 VIEW COMPARISON:  02/16/2018 FINDINGS: 3.2 cm bilobed nodular lesion projects in the left upper lung, previously 2.3 cm. On the lateral radiograph, there is suggestion these may represent two discrete lesions in anterior and posterior segments. There may be a 9 mm nodule in the left lower lung as well. Right lung clear. Heart size upper limits normal. Aortic Atherosclerosis (ICD10-170.0). No effusion. Degenerative change in the right shoulder. IMPRESSION: 1. Left lung nodules, enlarging, new since 12/19/2015. Consider CT chest with contrast when the patient is stable for further characterization. Electronically Signed   By: Lucrezia Europe M.D.   On: 02/24/2018 18:14   Dg Thoracic Spine 2 View  Result Date: 02/21/2018 CLINICAL DATA:  Intraoperative localization for T6-T10 fusion with pedicle screw fixation, posterolateral arthrodesis with tumor resection EXAM: THORACIC SPINE 2 VIEWS; DG C-ARM 61-120 MIN COMPARISON:  None. FINDINGS: Intraoperative fluoroscopic AP and lateral views of the mid-thoracic spine demonstrates sequelae of T6-T10 fusion with pedicle screw fixation, with posterolateral arthrodesis for presumed tumor resection. No adverse features. Fluoroscopic time equals 2 minutes and 27 seconds. IMPRESSION: Intraoperative localization for T6-T10 fusion and arthrodesis for tumor resection. Electronically Signed   By: Jeannine Boga M.D.   On: 02/02/2018 16:06   Dg Thoracic  Spine W/swimmers  Result Date: 02/10/2018 CLINICAL DATA:  mid-upper back pain that started Feb last year but got worse today. Pt went to Chiropractor last week and had her back re-aligned. Feels he may have been to hard and today she slept in later than her usual so dont know if that is what caused her back to hurt worse. H/o ovarian cancer and HTN. Former smoker. EXAM: THORACIC SPINE - 3 VIEWS COMPARISON:  02/16/2018 and earlier studies FINDINGS: Mild thoracic dextroscoliosis without evident underlying vertebral anomaly. Not all interspaces are well profiled on the lateral projections. No definite fracture. Anterior vertebral endplate spurring at multiple levels in the mid and lower thoracic spine. Aortic Atherosclerosis (ICD10-170.0). 2.2 cm nodular density projects in the left upper lung, not conspicuous on prior study of 12/19/2015. IMPRESSION: 1. Thoracic dextroscoliosis with multilevel endplate spurring, no definite acute abnormality. 2. Left upper lobe lung lesion. Electronically Signed  By: Lucrezia Europe M.D.   On: 02/13/2018 18:10   Ct Head Wo Contrast  Result Date: 03/01/2018 CLINICAL DATA:  Altered level of consciousness. EXAM: CT HEAD WITHOUT CONTRAST TECHNIQUE: Contiguous axial images were obtained from the base of the skull through the vertex without intravenous contrast. COMPARISON:  None. FINDINGS: Brain: Moderate atrophy and white matter disease is present. Age indeterminate lacunar infarcts are present in the thalami bilaterally. No acute infarct, hemorrhage, or mass lesion is present. Ventricles are proportionate to the degree of atrophy. No significant extra-axial fluid collection is present. Vascular: Atherosclerotic calcifications are present in the cavernous internal carotid arteries bilaterally. Skull: Calvarium is intact. No focal lytic or blastic lesions are present. No significant extracranial soft tissue injury is present. Sinuses/Orbits: A polyp or mucous retention cyst is present in  the left maxillary sinus. The paranasal sinuses and mastoid air cells are clear. Globes and orbits are within normal limits. IMPRESSION: 1. Moderate atrophy and white matter disease likely reflects the sequela of chronic microvascular ischemia. 2. No acute intracranial abnormality. 3. Age indeterminate lacunar infarcts of the thalami bilaterally are likely remote. 4. Atherosclerosis. 5. Polyp or mucous retention cyst involving the left maxillary sinus. Electronically Signed   By: San Morelle M.D.   On: 03/01/2018 12:20   Ct Chest W Contrast  Result Date: 02/19/2018 CLINICAL DATA:  Mid and upper back pain since February 2018, worsening today. Soft higher fracture last week. History of ovarian cancer. EXAM: CT CHEST WITH CONTRAST TECHNIQUE: Multidetector CT imaging of the chest was performed during intravenous contrast administration. CONTRAST:  65mL ISOVUE-300 IOPAMIDOL (ISOVUE-300) INJECTION 61% COMPARISON:  Chest radiograph February 23, 2018 FINDINGS: CARDIOVASCULAR: Heart size mildly enlarged. No pericardial effusion. Mild coronary artery calcification. Ascending aorta is 4.1 cm. Mild calcific atherosclerosis. MEDIASTINUM/NODES: 17 mm anterior mediastinal lymph node versus pulmonary nodule. No hilar, axillary or middle mediastinal lymphadenopathy. LUNGS/PLEURA: Tracheobronchial tree is patent, no pneumothorax. Moderate centrilobular emphysema. RIGHT upper lobe calcified granuloma. Measuring to 1.6 x 2.7 cm LEFT upper lobe. UPPER ABDOMEN: Nonacute.  Normal included adrenal glands. MUSCULOSKELETAL: Soft tissue mass LEFT T6 pedicle extending to the transverse process, laminar and spinous process. Severe canal stenosis consistent with epidural extension, narrowed to approximately 3 mm in transverse dimension (series 2, image 40). Mild-to-moderate T6 pathologic fracture with approximately 30-50% height loss. Severe degenerative change RIGHT glenohumeral joint space. Old sternal fracture. Osteopenia.  IMPRESSION: 1. Multiple pulmonary nodules measuring to 2.7 cm highly concerning for metastatic disease. Given low-density nodules, this could reflect mucinous metastasis. 2. Expansile mass LEFT T6 vertebral body consistent with tumor resulting in severe canal stenosis and cord compression. Mild to moderate T6 pathologic fracture. Recommend MRI of the thoracic spine with contrast and neuro surgical consultation. 3. 17 mm anterior mediastinal lymph node versus metastasis. 4. **An incidental finding of potential clinical significance has been found. 4.1 cm aneurysmal ascending aorta. Recommend annual imaging followup by CTA or MRA. This recommendation follows 2010 ACCF/AHA/AATS/ACR/ASA/SCA/SCAI/SIR/STS/SVM Guidelines for the Diagnosis and Management of Patients with Thoracic Aortic Disease. Circulation. 2010; 121: [id number]** 5. Acute findings discussed with and reconfirmed by PA.ABIGAIL HARRIS on 03/04/2018 at 9:39 pm. Aortic Atherosclerosis (ICD10-I70.0) and Emphysema (ICD10-J43.9). Electronically Signed   By: Elon Alas M.D.   On: 02/05/2018 21:40   Mr Brain Wo Contrast  Result Date: 03/02/2018 CLINICAL DATA:  Altered level of consciousness.  Metastatic disease EXAM: MRI HEAD WITHOUT CONTRAST TECHNIQUE: Multiplanar, multiecho pulse sequences of the brain and surrounding structures were obtained without intravenous contrast. COMPARISON:  CT head 03/01/2018 FINDINGS: Brain: Mild atrophy. Negative for acute infarct. Chronic white matter changes are present bilaterally likely due to microvascular ischemia. Negative for hemorrhage mass or edema. No evidence of metastatic disease on unenhanced images. Vascular: Normal arterial flow voids Skull and upper cervical spine: Negative Sinuses/Orbits: Retention cyst left maxillary sinus. Remaining sinuses clear. Normal orbit Other: None IMPRESSION: Atrophy and moderate chronic microvascular ischemia in the white matter. No acute abnormality. Electronically Signed   By:  Franchot Gallo M.D.   On: 03/02/2018 11:45   Mr Thoracic Spine W Wo Contrast  Addendum Date: 02/24/2018   ADDENDUM REPORT: 02/24/2018 09:26 ADDENDUM: Critical Value/emergent results were called by telephone at the time of interpretation on 02/24/2018 at 0914 hours to Dr. Zigmund Daniel, who verbally acknowledged these results. Electronically Signed   By: Genevie Ann M.D.   On: 02/24/2018 09:26   Result Date: 02/24/2018 CLINICAL DATA:  84 year old female status post treatment in 2018 of ovarian tumor. Abnormal chest CT yesterday highly suspicious for pulmonary and left T6 vertebral body metastatic disease. EXAM: MRI THORACIC WITHOUT AND WITH CONTRAST TECHNIQUE: Multiplanar and multiecho pulse sequences of the thoracic spine were obtained without and with intravenous contrast. CONTRAST:  44mL MULTIHANCE GADOBENATE DIMEGLUMINE 529 MG/ML IV SOLN COMPARISON:  Chest CT 02/21/2018 FINDINGS: Limited cervical spine imaging:  Negative. Thoracic spine segmentation: Normal as demonstrated on the recent CT. Alignment: Exaggerated upper thoracic kyphosis. Mild degenerative appearing anterolisthesis of T2 on T3. Vertebrae: There is a mildly edematous and enhancing T1 superior endplate compression fracture which was subtle on the recent CT. See series 11, image 7 and series 5, image 7. The remainder of the T1 vertebra appears within normal limits. Destructive and enhancing mass occupying the left T6 vertebra. The infiltrative appearing mass tracks throughout the left T6 vertebra, mildly crossing midline to the right in both the anterior and posterior elements (series 8, image 20), and also infiltrates the left 6th costovertebral junction and posterior rib (series 8, image 19). The tumor encompasses 31 539 x 35 millimeters (AP by transverse by CC) and enhances fairly homogeneously. There is invasion of the left T6 neural foramen and epidural space with rightward displacement of the spinal cord and mild spinal cord compression (series 7,  image 19). Mild if any associated cord edema. Moderate to severe left T6 neural foraminal stenosis. The right neural foramen is spared. T6 vertebral body height is relatively maintained. No other thoracic vertebral or posterior rib tumor. Cord: Cord compression with perhaps faint abnormal cord edema at the T6 level. Above and below T6 the spinal cord signal and morphology are normal. No abnormal intradural enhancement. However, there does appear to be a 2 cm segment of left posterolateral dural thickening and enhancement tracking caudally from the mass seen on series 11, image 8. No other dural thickening. Normal conus medullaris at T12-L1. Normal visible cauda equina nerve roots. Paraspinal and other soft tissues: Early left lateral extraosseous extension of tumor and paraspinal soft tissue involvement at T6. Other paraspinal soft tissues are negative. Posterior left upper lobe lung nodules redemonstrated on series 7, image 17. No pleural effusion. Negative visible upper abdominal viscera. Disc levels: Age concordant thoracic spine degeneration. No degenerative spinal stenosis. IMPRESSION: 1. Infiltrative and destructive tumor occupying the left T6 vertebra encompassing 31 x 39 x 35 mm. Extension into the epidural space, the left T6 neural foramen, the posterior left 6th rib, and adjacent paraspinal soft tissues. Bradford a linear 2 cm segment of left posterolateral dural thickening tracking  inferiorly to the T7 level. 2. T6 level spinal cord compression with possible mild cord edema. Moderate to severe left T6 neural foraminal stenosis. 3. There is a mild acute to subacute T1 superior endplate compression fracture, but this appears to be a benign rather than pathologic fracture. 4. No other thoracic spine metastatic disease identified. Electronically Signed: By: Genevie Ann M.D. On: 02/24/2018 08:55   Ct Abdomen Pelvis W Contrast  Result Date: 02/24/2018 CLINICAL DATA:  Thoracic spine metastasis. Concern for GYN  malignancy EXAM: CT ABDOMEN AND PELVIS WITH CONTRAST TECHNIQUE: Multidetector CT imaging of the abdomen and pelvis was performed using the standard protocol following bolus administration of intravenous contrast. CONTRAST:  132mL ISOVUE-300 IOPAMIDOL (ISOVUE-300) INJECTION 61% COMPARISON:  None. FINDINGS: Lower chest: Lung bases are clear. Hepatobiliary: No focal hepatic lesion. No biliary duct dilatation. Gallbladder is normal. Common bile duct is normal. Pancreas: Pancreas is normal. No ductal dilatation. No pancreatic inflammation. Spleen: Normal spleen Adrenals/urinary tract: Adrenal glands and kidneys are normal. The ureters and bladder normal. Stomach/Bowel: Stomach, small bowel, appendix, and cecum are normal. Moderate volume stool in the ascending colon. Rectosigmoid colon normal. Vascular/Lymphatic: Abdominal aorta is normal caliber with atherosclerotic calcification. There is no retroperitoneal or periportal lymphadenopathy. No pelvic lymphadenopathy. Reproductive: Large mass emanating from the pelvis measuring 13.8 x 12.0 by 13.3 cm. The mass is bilobed. There is no normal uterine tissue identified. No normal ovarian tissue identified. There multiple surgical clips positioned between the mass and bladder which may relate to prior GYN surgery/hysterectomy. There is masslike extension into the LEFT adnexal region measuring 5.6 x 4.3 cm. Nodularity extends to the the ventral peritoneal wall and musculature with asymmetric thickening to 2.5 cm in the LEFT rectus abdominus muscle. This asymmetric thickening extends the level the umbilicus (image 17/6 Other: No ascites.  No free-fluid Musculoskeletal: No aggressive osseous lesion. IMPRESSION: 1. Large bilobed mass extending from the pelvis consistent with GYN malignancy. 2. Lobular extension into the LEFT adnexal region with further extension into the ventral peritoneal surface involving the LEFT rectus abdominal muscle to the level of the umbilicus. 3. No  evidence bowel obstruction or renal obstruction. 4. No ascites. Electronically Signed   By: Suzy Bouchard M.D.   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 Imaging evidence of recurrent tumor in the pelvis with peritoneal spread and metastases to the thoracic spine and lungs. Biopsy needed to confirm recurrence. EXAM: CT GUIDED CORE BIOPSY OF PERITONEAL MASS ANESTHESIA/SEDATION: 2.0 mg IV Versed; 50 mcg IV Fentanyl Total Moderate Sedation Time:  10 minutes. The patient's level of consciousness and physiologic status were continuously monitored during the procedure by Radiology nursing. PROCEDURE: The procedure risks, benefits, and alternatives were explained to the patient. Questions regarding the procedure were encouraged and answered. The patient understands and consents to the procedure. CT was performed through the pelvis in a supine position. The left lower abdominal wall was prepped with chlorhexidine in a sterile fashion, and a sterile drape was applied covering the operative field. A sterile gown and sterile gloves were used for the procedure. Local anesthesia was provided with 1% Lidocaine. A 17 gauge trocar needle was advanced into tumor within the left lower pelvis. After confirming needle tip position, 4 separate coaxial 18 gauge core biopsy samples were obtained and submitted in formalin. Additional imaging was performed after outer needle removal. COMPLICATIONS: None FINDINGS: Lobular tumor mass extending to abut  the left lower abdominal wall just above the hip joint was targeted. Solid tissue was obtained. IMPRESSION: CT-guided core biopsy performed of tumor within the left lower pelvic peritoneal cavity. Electronically Signed   By: Aletta Edouard M.D.   On: 02/25/2018 15:53   Dg Chest Port 1 View  Result Date: 03/01/2018 CLINICAL DATA:  COPD, dyspnea, ovarian cancer EXAM: PORTABLE CHEST 1 VIEW  COMPARISON:  03/04/2018 chest radiograph. FINDINGS: Bilateral posterior spinal fusion hardware overlies the upper thoracic spine. Stable cardiomediastinal silhouette with top-normal heart size. No pneumothorax. No pleural effusion. No pulmonary edema. Stable 3 cm circumscribed ovoid left upper lung mass. Platelike atelectasis at both lung bases, increased on the right and stable on the left. IMPRESSION: 1. Platelike bibasilar lung atelectasis, increased on the right and stable on the left. 2. Stable known left upper lung mass. Electronically Signed   By: Ilona Sorrel M.D.   On: 03/01/2018 08:02   Dg Abd Portable 2v  Result Date: 03/01/2018 CLINICAL DATA:  Abdominal distension. EXAM: PORTABLE ABDOMEN - 2 VIEW COMPARISON:  None. FINDINGS: Moderate fecal loading in the colon. No evidence of bowel obstruction. No free air, portal venous gas, or pneumatosis identified. IMPRESSION: Moderate fecal loading in the colon. Stable left lung mass better seen on chest x-rays. Electronically Signed   By: Dorise Bullion III M.D   On: 03/01/2018 17:43   Dg C-arm 1-60 Min  Result Date: 02/14/2018 CLINICAL DATA:  Intraoperative localization for T6-T10 fusion with pedicle screw fixation, posterolateral arthrodesis with tumor resection EXAM: THORACIC SPINE 2 VIEWS; DG C-ARM 61-120 MIN COMPARISON:  None. FINDINGS: Intraoperative fluoroscopic AP and lateral views of the mid-thoracic spine demonstrates sequelae of T6-T10 fusion with pedicle screw fixation, with posterolateral arthrodesis for presumed tumor resection. No adverse features. Fluoroscopic time equals 2 minutes and 27 seconds. IMPRESSION: Intraoperative localization for T6-T10 fusion and arthrodesis for tumor resection. Electronically Signed   By: Jeannine Boga M.D.   On: 02/22/2018 16:06   Dg C-arm 1-60 Min  Result Date: 02/07/2018 CLINICAL DATA:  Intraoperative localization for T6-T10 fusion with pedicle screw fixation, posterolateral arthrodesis with tumor  resection EXAM: THORACIC SPINE 2 VIEWS; DG C-ARM 61-120 MIN COMPARISON:  None. FINDINGS: Intraoperative fluoroscopic AP and lateral views of the mid-thoracic spine demonstrates sequelae of T6-T10 fusion with pedicle screw fixation, with posterolateral arthrodesis for presumed tumor resection. No adverse features. Fluoroscopic time equals 2 minutes and 27 seconds. IMPRESSION: Intraoperative localization for T6-T10 fusion and arthrodesis for tumor resection. Electronically Signed   By: Jeannine Boga M.D.   On: 02/26/2018 16:06    I spent 60 minutes counseling the patient face to face. The total time spent in the appointment was 80 minutes and more than 50% was on counseling.

## 2018-03-03 NOTE — Progress Notes (Signed)
PROGRESS NOTE  Susan Bradford BZJ:[id number] DOB: Mar 16, 1934 DOA: 02/13/2018 PCP: Susan Seashore, MD  HPI/Recap of past 27 hours: 84 year old with past medical history relevant for hypertension, hyperlipidemia, hypothyroidism, depression/anxiety, asthma/COPD, ovarian cancer status post resection in April 2018 who was admitted on 02/22/2018 for severe back pain and found to have widely metastatic disease (likely ovarian as primary) and T6 tumor cord compression. Pt admitted for further management.  Today, pt noted to be more awake, complaining of pain all her body. More alert, awake, oriented. Denies any chest pain, abdominal pain, N/N/D/C, fever/chills.   Assessment/Plan: Principal Problem:   Spinal cord compression due to malignant neoplasm metastatic to spine [facility]) Active Problems:   Ovarian cancer, unspecified laterality ([place])   Metastatic cancer to spine ([place])   Back pain   Ovarian cancer ([place])  Acute metabolic encephalopathy Improved Vital signs remained stable, lab work unremarkable Likely due to oversedation secondary to multiple narcotics, sedatives given to the patient on 02/28/18 Patient received frequent doses of fentanyl, Norco, scheduled doses of gabapentin, Valium, Ativan Status post 2 doses of Narcan with some slow improvements ABG showed hypoxia UA negative, repeat unremarkable X-ray showed known lung mass, no pneumonia noted CT head negative for any acute intracranial abnormalities, does show old possible lacunar infarcts MRI no acute intracranial abnormalities  Adjusted pain meds and sedatives, informed RN Monitor closely on telemetry  T6 cord compression s/p epidural tumor resection on 02/18/2018  Likely due to tumor/malignant metastases No focal neurologic deficit noted Neurosurgery on board Radiation oncology will follow up after surgery No steroids per neurosurgery Frequent neuro checks, monitor closely  Metastatic ovarian cancer 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, diagnosed in April 2018 at [place] abd/pelvis/chest showed extensive mets, likely from OBGYN source CT guided biopsy done on 02/25/2018 of abdominal/pelvis mass shows metastatic recurrent granulosa cell CA Oncology on board, Susan Bradford  Continue tramadol for pain control, patient has multiple allergies to stronger opiates  Hypertension/hyperlipidemia Continue triamterene-HCTZ 37.5-25 mg Continue atorvastatin 80 mg daily  Hypothyroidism Continue levothyroxine 50 mg daily  Asthma/COPD Continue inhaler  Depression/anxiety Continue sertraline 50 mg daily, PRN lorazepam     Code Status: Full  Family Communication: None at bedside  Disposition Plan: SNF   Consultants:  Neurosurgery  Radiation oncology  Oncology  IR  Procedures:  Surgery on 02/24/2018  Antimicrobials:  None  DVT prophylaxis: Lovenox   Objective: Vitals:   03/02/18 2333 03/03/18 0317 03/03/18 0819 03/03/18 0840  BP: (!) 155/91 (!) 141/70 (!) 159/77   Pulse: 100 76 74 78  Resp: 20 18 17 18   Temp: 99.2 F (37.3 C) 98.3 F (36.8 C) 98.7 F (37.1 C)   TempSrc: Oral Oral Oral   SpO2: 96% 94% 90% 91%  Weight:      Height:        Intake/Output Summary (Last 24 hours) at 03/03/2018 1031 Last data filed at 03/03/2018 0048 Gross per 24 hour  Intake -  Output 600 ml  Net -600 ml   Filed Weights   02/24/18 1113 02/22/2018 1135  Weight: 63.6 kg (140 lb 3.4 oz) 63.6 kg (140 lb 3.4 oz)    Exam:   General: Awake, alert, oriented  Cardiovascular: S1, S2 present  Respiratory: CTAB  Abdomen: Soft, nontender, ND, bowel sounds present  Musculoskeletal: No pedal edema bilaterally, back incision C/D/I  Skin: Normal  Psychiatry: Normal   Data Reviewed: CBC: Recent Labs  Lab 02/25/18 0354 02/28/18 0436 03/01/18  5809 03/02/18 0644 03/03/18 0503  WBC 6.2 11.0* 11.2* 11.7* 8.3  NEUTROABS  --  8.6* 9.3* 9.8* 6.5  HGB 12.6 11.9* 11.0* 10.7*  9.3*  HCT 37.8 38.1 34.2* 33.2* 27.8*  MCV 88.1 91.1 89.1 89.0 89.1  PLT 254 246 221 206 983   Basic Metabolic Panel: Recent Labs  Lab 02/25/18 0354 02/28/18 0436 03/01/18 0638 03/02/18 0644 03/03/18 0503  NA 136 133* 131* 133* 131*  K 3.6 4.4 3.7 3.5 3.0*  CL 93* 92* 90* 91* 93*  CO2 29 30 34* 31 31  GLUCOSE 110* 102* 130* 120* 128*  BUN 14 17 16  24* 27*  CREATININE 0.59 0.75 0.88 0.91 0.77  CALCIUM 11.1* 11.1* 10.9* 10.8* 10.2   GFR: Estimated Creatinine Clearance: 49.6 mL/min (by C-G formula based on SCr of 0.77 mg/dL). Liver Function Tests: No results for input(s): AST, ALT, ALKPHOS, BILITOT, PROT, ALBUMIN in the last 168 hours. No results for input(s): LIPASE, AMYLASE in the last 168 hours. No results for input(s): AMMONIA in the last 168 hours. Coagulation Profile: Recent Labs  Lab 02/25/18 0919  INR 1.02   Cardiac Enzymes: No results for input(s): CKTOTAL, CKMB, CKMBINDEX, TROPONINI in the last 168 hours. BNP (last 3 results) No results for input(s): PROBNP in the last 8760 hours. HbA1C: No results for input(s): HGBA1C in the last 72 hours. CBG: No results for input(s): GLUCAP in the last 168 hours. Lipid Profile: No results for input(s): CHOL, HDL, LDLCALC, TRIG, CHOLHDL, LDLDIRECT in the last 72 hours. Thyroid Function Tests: No results for input(s): TSH, T4TOTAL, FREET4, T3FREE, THYROIDAB in the last 72 hours. Anemia Panel: No results for input(s): VITAMINB12, FOLATE, FERRITIN, TIBC, IRON, RETICCTPCT in the last 72 hours. Urine analysis:    Component Value Date/Time   COLORURINE YELLOW 03/03/2018 0327   APPEARANCEUR HAZY (A) 03/03/2018 0327   LABSPEC 1.018 03/03/2018 0327   PHURINE 5.0 03/03/2018 0327   GLUCOSEU NEGATIVE 03/03/2018 0327   HGBUR MODERATE (A) 03/03/2018 0327   BILIRUBINUR NEGATIVE 03/03/2018 0327   KETONESUR NEGATIVE 03/03/2018 0327   PROTEINUR NEGATIVE 03/03/2018 0327   NITRITE NEGATIVE 03/03/2018 0327   LEUKOCYTESUR NEGATIVE  03/03/2018 0327   Sepsis Labs: @LABRCNTIP (procalcitonin:4,lacticidven:4)  ) Recent Results (from the past 240 hour(s))  Surgical pcr screen     Status: None   Collection Time: 02/25/2018 10:25 AM  Result Value Ref Range Status   MRSA, PCR NEGATIVE NEGATIVE Final   Staphylococcus aureus NEGATIVE NEGATIVE Final    Comment: (NOTE) The Xpert SA Assay (FDA approved for NASAL specimens in patients 11 years of age and older), is one component of a comprehensive surveillance program. It is not intended to diagnose infection nor to guide or monitor treatment. Performed at [facility], [place] [address] [place], [place] [address]       Studies: Mr Brain Wo Contrast  Result Date: 03/02/2018 CLINICAL DATA:  Altered level of consciousness.  Metastatic disease EXAM: MRI HEAD WITHOUT CONTRAST TECHNIQUE: Multiplanar, multiecho pulse sequences of the brain and surrounding structures were obtained without intravenous contrast. COMPARISON:  CT head 03/01/2018 FINDINGS: Brain: Mild atrophy. Negative for acute infarct. Chronic white matter changes are present bilaterally likely due to microvascular ischemia. Negative for hemorrhage mass or edema. No evidence of metastatic disease on unenhanced images. Vascular: Normal arterial flow voids Skull and upper cervical spine: Negative Sinuses/Orbits: Retention cyst left maxillary sinus. Remaining sinuses clear. Normal orbit Other: None IMPRESSION: Atrophy and moderate chronic microvascular ischemia in the white matter.  No acute abnormality. Electronically Signed   By: Franchot Gallo M.D.   On: 03/02/2018 11:45    Scheduled Meds: . atorvastatin  80 mg Oral q1800  . bisacodyl  10 mg Rectal Daily  . feeding supplement (ENSURE ENLIVE)  237 mL Oral BID BM  . levothyroxine  50 mcg Oral Daily  . mometasone-formoterol  2 puff Inhalation BID  . senna-docusate  1 tablet Oral BID  . sertraline  50 mg Oral Daily  . sodium chloride flush  3 mL Intravenous Q12H  .  triamterene-hydrochlorothiazide  1 tablet Oral Daily    Continuous Infusions: . sodium chloride    . sodium chloride    . lactated ringers Stopped (02/28/18 0526)     LOS: 7 days     Alma Friendly, MD [facility]   If 7PM-7AM, please contact night-coverage [email or web address] Password [email or web address] 03/03/2018, 10:31 AM

## 2018-03-03 NOTE — Telephone Encounter (Signed)
Called patient's daughter, [name], about talking with Dr. Alvy Bimler today.  She said she would be available at 3:45 pm today.

## 2018-03-03 NOTE — Progress Notes (Signed)
Patient ID: Susan Bradford, female   DOB: 04-07-1934, 84 y.o.   MRN: [id number] BP 122/62 (BP Location: Right Arm)   Pulse 92   Temp 98.7 F (37.1 C) (Oral)   Resp 18   Ht 5\' 5"  (1.651 m)   Wt 63.6 kg (140 lb 3.4 oz)   SpO2 98%   BMI 23.33 kg/m  Currently mood is depressed. She is still dealing with her prognosis, and today's news. Neurologically intact. Wound is clean, and dry Will follow.

## 2018-03-03 NOTE — Progress Notes (Signed)
RT was notified by pt RN that she refused her dulera treatment. RN stated pt has just found out she is terminally ill with cancer and may be made comfort care soon.

## 2018-03-03 NOTE — Progress Notes (Signed)
Physical Therapy Treatment Patient Details Name: Susan Bradford MRN: [id number] DOB: [birth year] Today's Date: 03/03/2018    History of Present Illness 84 year old with past medical history relevant for hypertension, hyperlipidemia, hypothyroidism, depression/anxiety, asthma/COPD, ovarian cancer status post resection in April 2018 who was admitted on 02/22/2018 for severe back pain and found to have widely metastatic disease (likely ovarian as primary) and T6 tumor cord compression. Patient now s/p Thoracic 6 to Thoracic 10 fusion with pedicle screws/posterolateral arthrodesis/tumor resection at Thoracic 8     PT Comments    Patient agreeable to participate in therapy despite reporting pain "all over". Pt required +2 for safety with OOB transfers. Continue to progress as tolerated with anticipated d/c to SNF for further skilled PT services.     Follow Up Recommendations  SNF;Supervision/Assistance - 24 hour     Equipment Recommendations  (TBD may need wheel chair)    Recommendations for Other Services       Precautions / Restrictions Precautions Precautions: Back    Mobility  Bed Mobility Overal bed mobility: Needs Assistance Bed Mobility: Rolling;Sidelying to Sit Rolling: Mod assist Sidelying to sit: Max assist       General bed mobility comments: pt able to bring bilat LE to EOB and assistance required to bring hips to EOB and to elevate trunk into sitting; use of rail and HOB elevated; cues for sequencing  Transfers Overall transfer level: Needs assistance Equipment used: Rolling walker (2 wheeled) Transfers: Sit to/from [facility] Sit to Stand: +2 physical assistance;Mod assist Stand pivot transfers: +2 physical assistance;Mod assist       General transfer comment: assist to power up into standing and for balance and guiding RW when pivoting; cues for posture and sequencing  Ambulation/Gait                 Stairs              Wheelchair Mobility    Modified Rankin (Stroke Patients Only)       Balance Overall balance assessment: Needs assistance Sitting-balance support: Feet supported;Single extremity supported Sitting balance-Leahy Scale: Fair     Standing balance support: Bilateral upper extremity supported;During functional activity Standing balance-Leahy Scale: Poor                              Cognition Arousal/Alertness: Awake/alert Behavior During Therapy: WFL for tasks assessed/performed Overall Cognitive Status: Within Functional Limits for tasks assessed                                        Exercises      General Comments        Pertinent Vitals/Pain Pain Assessment: Faces Faces Pain Scale: Hurts even more Pain Location: "all over" Pain Descriptors / Indicators: Aching;Grimacing;Guarding;Sore;Operative site guarding Pain Intervention(s): Limited activity within patient's tolerance;Monitored during session;Premedicated before session;Repositioned    Home Living                      Prior Function            PT Goals (current goals can now be found in the care plan section) Acute Rehab PT Goals PT Goal Formulation: With patient Time For Goal Achievement: 03/14/18 Potential to Achieve Goals: Good Progress towards PT goals: Progressing toward goals    Frequency    Min  5X/week      PT Plan Current plan remains appropriate    Co-evaluation              AM-PAC PT "6 Clicks" Daily Activity  Outcome Measure  Difficulty turning over in bed (including adjusting bedclothes, sheets and blankets)?: Unable Difficulty moving from lying on back to sitting on the side of the bed? : Unable Difficulty sitting down on and standing up from a chair with arms (e.g., wheelchair, bedside commode, etc,.)?: Unable Help needed moving to and from a bed to chair (including a wheelchair)?: A Lot Help needed walking in hospital room?: A  Lot Help needed climbing 3-5 steps with a railing? : Total 6 Click Score: 8    End of Session Equipment Utilized During Treatment: Oxygen Activity Tolerance: Patient limited by pain Patient left: in chair;with call bell/phone within reach;with nursing/sitter in room;with family/visitor present Nurse Communication: Mobility status PT Visit Diagnosis: Muscle weakness (generalized) (M62.81);Unsteadiness on feet (R26.81);Difficulty in walking, not elsewhere classified (R26.2);Other symptoms and signs involving the nervous system (X25.271)     Time: 2929-0903 PT Time Calculation (min) (ACUTE ONLY): 25 min  Charges:  $Therapeutic Activity: 23-37 mins                     Earney Navy, PTA Pager: [phone]     Darliss Cheney 03/03/2018, 3:15 PM

## 2018-03-04 DIAGNOSIS — F411 Generalized anxiety disorder: Secondary | ICD-10-CM

## 2018-03-04 DIAGNOSIS — M8448XA Pathological fracture, other site, initial encounter for fracture: Secondary | ICD-10-CM

## 2018-03-04 DIAGNOSIS — C7951 Secondary malignant neoplasm of bone: Principal | ICD-10-CM

## 2018-03-04 DIAGNOSIS — G9529 Other cord compression: Secondary | ICD-10-CM

## 2018-03-04 DIAGNOSIS — C7800 Secondary malignant neoplasm of unspecified lung: Secondary | ICD-10-CM

## 2018-03-04 DIAGNOSIS — M546 Pain in thoracic spine: Secondary | ICD-10-CM

## 2018-03-04 DIAGNOSIS — Z7189 Other specified counseling: Secondary | ICD-10-CM

## 2018-03-04 LAB — CBC WITH DIFFERENTIAL/PLATELET
ABS IMMATURE GRANULOCYTES: 0.1 10*3/uL (ref 0.0–0.1)
Basophils Absolute: 0 10*3/uL (ref 0.0–0.1)
Basophils Relative: 1 %
Eosinophils Absolute: 0.1 10*3/uL (ref 0.0–0.7)
Eosinophils Relative: 2 %
HEMATOCRIT: 25.1 % — AB (ref 36.0–46.0)
Hemoglobin: 8.2 g/dL — ABNORMAL LOW (ref 12.0–15.0)
IMMATURE GRANULOCYTES: 1 %
LYMPHS PCT: 6 %
Lymphs Abs: 0.5 10*3/uL — ABNORMAL LOW (ref 0.7–4.0)
MCH: 29.3 pg (ref 26.0–34.0)
MCHC: 32.7 g/dL (ref 30.0–36.0)
MCV: 89.6 fL (ref 78.0–100.0)
MONO ABS: 1.1 10*3/uL — AB (ref 0.1–1.0)
MONOS PCT: 15 %
NEUTROS ABS: 5.4 10*3/uL (ref 1.7–7.7)
Neutrophils Relative %: 75 %
Platelets: 192 10*3/uL (ref 150–400)
RBC: 2.8 MIL/uL — ABNORMAL LOW (ref 3.87–5.11)
RDW: 13.3 % (ref 11.5–15.5)
WBC: 7.2 10*3/uL (ref 4.0–10.5)

## 2018-03-04 LAB — BASIC METABOLIC PANEL
ANION GAP: 7 (ref 5–15)
BUN: 26 mg/dL — ABNORMAL HIGH (ref 8–23)
CHLORIDE: 92 mmol/L — AB (ref 98–111)
CO2: 31 mmol/L (ref 22–32)
Calcium: 10.1 mg/dL (ref 8.9–10.3)
Creatinine, Ser: 0.82 mg/dL (ref 0.44–1.00)
GFR calc Af Amer: >60 mL/min (ref >60)
GFR calc non Af Amer: >60 mL/min (ref >60)
GLUCOSE: 102 mg/dL — AB (ref 70–99)
POTASSIUM: 3.7 mmol/L (ref 3.5–5.1)
Sodium: 130 mmol/L — ABNORMAL LOW (ref 135–145)

## 2018-03-04 MED ORDER — ACETAMINOPHEN 160 MG/5ML PO SOLN
650.0000 mg | Freq: Three times a day (TID) | ORAL | Status: DC
  Administered 2018-03-04 – 2018-03-06 (×5): 650 mg via ORAL
  Filled 2018-03-04 (×7): qty 20.3

## 2018-03-04 MED ORDER — ACETAMINOPHEN 160 MG/5ML PO SOLN: 500.0000 mg | Freq: Three times a day (TID) | ORAL | Status: DC

## 2018-03-04 MED ORDER — SENNOSIDES-DOCUSATE SODIUM 8.6-50 MG PO TABS
2.0000 | ORAL_TABLET | Freq: Two times a day (BID) | ORAL | Status: DC
  Administered 2018-03-04 – 2018-03-06 (×5): 2 via ORAL
  Filled 2018-03-04 (×6): qty 2

## 2018-03-04 NOTE — Progress Notes (Signed)
Nutrition Follow-up  DOCUMENTATION CODES:   Not applicable  INTERVENTION:  Continue Ensure Enlive po BID, each supplement provides 350 kcal and 20 grams of protein  Monitor GOC  NUTRITION DIAGNOSIS:   Increased nutrient needs related to cancer and cancer related treatments as evidenced by estimated needs. -ongoing  GOAL:   Patient will meet greater than or equal to 90% of their needs -unmet  MONITOR:   PO intake, Supplement acceptance, Weight trends, Labs, Diet advancement  ASSESSMENT:   Patient with PMH significant for HTN Stage IC granulosa cell tumor (diagnosed 11/2016) s/p exploratory laparotomy, radical resection of pelvic tumor with bilateral salpingo-oophorectomy, partial omentectomy, lymphadenectomy, cystoscopy on 11/05/16. Presents this admission with met to T6 causing apparent cord compression on CT scan.   She is s/p tumor resection 02/11/2018 Met with palliative care today, waiting to make a decision between palliative radiation and hospice. Ate a few bites, has a very distended abdomen, constipation, and feels full quickly. Sipping ensure.  Labs reviewed:  Na 130, BUN 26 Medications reviewed and include:  Dulcolax, Senokot-S  Diet Order:   Diet Order           Diet regular Room service appropriate? Yes; Fluid consistency: Thin  Diet effective now          EDUCATION NEEDS:   Education needs have been addressed  Skin:  Skin Assessment: Skin Integrity Issues: Skin Integrity Issues:: Incisions Incisions: Closed to back  Last BM:  03/02/2018  Height:   Ht Readings from Last 1 Encounters:  02/13/2018 5' 5" (1.651 m)    Weight:   Wt Readings from Last 1 Encounters:  02/08/2018 140 lb 3.4 oz (63.6 kg)    Ideal Body Weight:  56.8 kg  BMI:  Body mass index is 23.33 kg/m.  Estimated Nutritional Needs:   Kcal:  1600-1800 kcal  Protein:  80-90 grams   Fluid:  >1.6 L/day    William M. Ward, MS, RD LDN Inpatient Clinical Dietitian Pager  [phone]  

## 2018-03-04 NOTE — Progress Notes (Signed)
Physical Therapy Treatment Patient Details Name: Susan Bradford MRN: [id number] DOB: 05-07-1934 Today's Date: 03/04/2018    History of Present Illness 84 year old with past medical history relevant for hypertension, hyperlipidemia, hypothyroidism, depression/anxiety, asthma/COPD, ovarian cancer status post resection in April 2018 who was admitted on 02/22/2018 for severe back pain and found to have widely metastatic disease (likely ovarian as primary) and T6 tumor cord compression. Patient now s/p Thoracic 6 to Thoracic 10 fusion with pedicle screws/posterolateral arthrodesis/tumor resection at Thoracic 8 03/04/18 Pending palliative consult dx of Stage IV cancer provided on 7/30 per notes    PT Comments    Good progression of mobility today. Able to stand at sink side for ~6 minutes with B UE support without LE buckle. Short ambulation distance in room with RW - requires verbal cueing for stepping pattern and safe obstacle navigation. Daughter present for session and providing encouragement to patient. PT to continue to follow.  Follow Up Recommendations  SNF;Supervision/Assistance - 24 hour     Equipment Recommendations  (TBD may need w/c)    Recommendations for Other Services       Precautions / Restrictions Precautions Precautions: Back Precaution Comments: cues for back precautions Restrictions Weight Bearing Restrictions: No    Mobility  Bed Mobility Overal bed mobility: Needs Assistance Bed Mobility: Rolling;Supine to Sit Rolling: Min assist   Supine to sit: Mod assist     General bed mobility comments: pateint sitting EOB with BUE support for ~3 min without LOB  Transfers Overall transfer level: Needs assistance Equipment used: Rolling walker (2 wheeled) Transfers: Sit to/from Stand Sit to Stand: Mod assist;From elevated surface;+2 physical assistance         General transfer comment: Mod A +2 to power up from bedside; verbal cueing for hand placement; PT  guarding at R knee to prevent buckling  Ambulation/Gait Ambulation/Gait assistance: Min assist;Mod assist;+2 physical assistance;+2 safety/equipment Gait Distance (Feet): 15 Feet Assistive device: Rolling walker (2 wheeled) Gait Pattern/deviations: Step-to pattern;Decreased stride length;Shuffle Gait velocity: decreased   General Gait Details: verbal cueing for upright posturing, safety, and obstacle navigation; cueing for hand placement prior to sit for safety   Stairs             Wheelchair Mobility    Modified Rankin (Stroke Patients Only)       Balance Overall balance assessment: Needs assistance Sitting-balance support: Feet supported;Bilateral upper extremity supported Sitting balance-Leahy Scale: Fair     Standing balance support: Bilateral upper extremity supported;During functional activity Standing balance-Leahy Scale: Poor Standing balance comment: reliance on UE support                            Cognition Arousal/Alertness: Awake/alert Behavior During Therapy: WFL for tasks assessed/performed Overall Cognitive Status: Within Functional Limits for tasks assessed Area of Impairment: Safety/judgement;Awareness                         Safety/Judgement: Decreased awareness of deficits;Decreased awareness of safety Awareness: Emergent Problem Solving: Requires verbal cues;Requires tactile cues        Exercises      General Comments General comments (skin integrity, edema, etc.): wound not covered; dry and clean incision      Pertinent Vitals/Pain Pain Assessment: Faces Faces Pain Scale: Hurts even more Pain Location: back Pain Descriptors / Indicators: Grimacing;Guarding;Moaning Pain Intervention(s): Limited activity within patient's tolerance;Monitored during session;Repositioned;Premedicated before session    Home Living  Prior Function            PT Goals (current goals can now be found  in the care plan section) Acute Rehab PT Goals Patient Stated Goal: none stated PT Goal Formulation: With patient Time For Goal Achievement: 03/14/18 Potential to Achieve Goals: Good Progress towards PT goals: Progressing toward goals    Frequency    Min 5X/week      PT Plan Current plan remains appropriate    Co-evaluation PT/OT/SLP Co-Evaluation/Treatment: Yes Reason for Co-Treatment: For patient/therapist safety;To address functional/ADL transfers PT goals addressed during session: Mobility/safety with mobility OT goals addressed during session: ADL's and self-care;Proper use of Adaptive equipment and DME;Strengthening/ROM      AM-PAC PT "6 Clicks" Daily Activity  Outcome Measure  Difficulty turning over in bed (including adjusting bedclothes, sheets and blankets)?: Unable Difficulty moving from lying on back to sitting on the side of the bed? : Unable Difficulty sitting down on and standing up from a chair with arms (e.g., wheelchair, bedside commode, etc,.)?: Unable Help needed moving to and from a bed to chair (including a wheelchair)?: A Little Help needed walking in hospital room?: A Little Help needed climbing 3-5 steps with a railing? : Total 6 Click Score: 10    End of Session Equipment Utilized During Treatment: Oxygen Activity Tolerance: Patient tolerated treatment well Patient left: in chair;with call bell/phone within reach;with chair alarm set;with family/visitor present Nurse Communication: Mobility status PT Visit Diagnosis: Muscle weakness (generalized) (M62.81);Unsteadiness on feet (R26.81);Difficulty in walking, not elsewhere classified (R26.2);Other symptoms and signs involving the nervous system (X45.038)     Time: 0822-0911 PT Time Calculation (min) (ACUTE ONLY): 49 min  Charges:  $Gait Training: 8-22 mins                     Lanney Gins, PT, DPT 03/04/18 11:41 AM Pager: [phone]

## 2018-03-04 NOTE — Progress Notes (Signed)
Occupational Therapy Treatment Patient Details Name: Susan Bradford MRN: [id number] DOB: Sep 03, 1933 Today's Date: 03/04/2018    History of present illness 84 year old with past medical history relevant for hypertension, hyperlipidemia, hypothyroidism, depression/anxiety, asthma/COPD, ovarian cancer status post resection in April 2018 who was admitted on 02/22/2018 for severe back pain and found to have widely metastatic disease (likely ovarian as primary) and T6 tumor cord compression. Patient now s/p Thoracic 6 to Thoracic 10 fusion with pedicle screws/posterolateral arthrodesis/tumor resection at Thoracic 8 03/04/18 Pending palliative consult dx of Stage IV cancer provided on 7/30 per notes   OT comments  Pt progressed to sink level task and sitting in recliner. Pt with PO intake and positive attitude. Pts daughter arriving and present during session. Pt reports only hurting in her back today which patient states "I can't believe that's the only thing hurting today".   Follow Up Recommendations  SNF;Supervision/Assistance - 24 hour    Equipment Recommendations  None recommended by OT    Recommendations for Other Services      Precautions / Restrictions Precautions Precautions: Back Precaution Comments: cues for back precautions Restrictions Weight Bearing Restrictions: No       Mobility Bed Mobility Overal bed mobility: Needs Assistance Bed Mobility: Rolling;Supine to Sit Rolling: Min assist   Supine to sit: Mod assist     General bed mobility comments: pt able to static sit with bil UE support  Transfers Overall transfer level: Needs assistance Equipment used: Rolling walker (2 wheeled) Transfers: Sit to/from Stand Sit to Stand: +2 physical assistance;Mod assist;From elevated surface         General transfer comment: pt requires (A) to power up from seated position. pt with x1 event of R LE bunkle but able to self correct    Balance     Sitting balance-Leahy  Scale: Fair       Standing balance-Leahy Scale: Poor Standing balance comment: reliance on UE support                           ADL either performed or assessed with clinical judgement   ADL Overall ADL's : Needs assistance/impaired Eating/Feeding: Set up Eating/Feeding Details (indicate cue type and reason): pt completed 1/2 container of grids and 3/4 container of ice cream Grooming: Minimal assistance;Sitting Grooming Details (indicate cue type and reason): pt requires seated position with rest breaks to wash face, brush teeth and mod (A) to comb hair      Lower Body Bathing: Maximal assistance Lower Body Bathing Details (indicate cue type and reason): pt incontinent with static standing and needs (A) for peri care                     Functional mobility during ADLs: +2 for physical assistance;Moderate assistance General ADL Comments: pt completed bed mobility to sink level sitting then progressed to chair sitting. pt reports discomfort of back at surgerical site. pt also c/o itching on R flank     Vision       Perception     Praxis      Cognition Arousal/Alertness: Awake/alert Behavior During Therapy: WFL for tasks assessed/performed Overall Cognitive Status: Within Functional Limits for tasks assessed Area of Impairment: Safety/judgement;Awareness                         Safety/Judgement: Decreased awareness of safety;Decreased awareness of deficits Awareness: Emergent  Exercises     Shoulder Instructions       General Comments wound open to air and dry. pt provided warm wash cloth to back to help decr scratching at R flank    Pertinent Vitals/ Pain       Pain Assessment: Faces Faces Pain Scale: Hurts even more Pain Location: back Pain Descriptors / Indicators: Grimacing Pain Intervention(s): Monitored during session;Repositioned;Premedicated before session  Home Living                                           Prior Functioning/Environment              Frequency  Min 2X/week        Progress Toward Goals  OT Goals(current goals can now be found in the care plan section)  Progress towards OT goals: Progressing toward goals  Acute Rehab OT Goals Patient Stated Goal: none stated OT Goal Formulation: With patient Time For Goal Achievement: 03/14/18 Potential to Achieve Goals: Good ADL Goals Pt Will Perform Grooming: with set-up;with supervision Pt Will Perform Upper Body Bathing: with set-up;with supervision Pt Will Perform Upper Body Dressing: with supervision;with set-up Pt Will Transfer to Toilet: with mod assist;with +2 assist;stand pivot transfer Additional ADL Goal #1: Pt will be Mod for in and OOB for basic ADLs  Plan Discharge plan remains appropriate    Co-evaluation    PT/OT/SLP Co-Evaluation/Treatment: Yes Reason for Co-Treatment: To address functional/ADL transfers;For patient/therapist safety;Complexity of the patient's impairments (multi-system involvement)   OT goals addressed during session: ADL's and self-care;Proper use of Adaptive equipment and DME;Strengthening/ROM      AM-PAC PT "6 Clicks" Daily Activity     Outcome Measure   Help from another person eating meals?: A Little Help from another person taking care of personal grooming?: A Little Help from another person toileting, which includes using toliet, bedpan, or urinal?: Total Help from another person bathing (including washing, rinsing, drying)?: A Lot Help from another person to put on and taking off regular upper body clothing?: A Lot Help from another person to put on and taking off regular lower body clothing?: Total 6 Click Score: 12    End of Session Equipment Utilized During Treatment: Gait belt;Rolling walker;Oxygen  OT Visit Diagnosis: Other abnormalities of gait and mobility (R26.89);Muscle weakness (generalized) (M62.81)   Activity Tolerance Patient tolerated  treatment well   Patient Left in chair;with call bell/phone within reach;with chair alarm set;with family/visitor present   Nurse Communication Mobility status;Precautions        Time: 0822-0911 OT Time Calculation (min): 49 min  Charges: OT General Charges $OT Visit: 1 Visit OT Treatments $Self Care/Home Management : 23-37 mins   Jeri Modena   OTR/L Pager: [phone] Office: [phone] .    Parke Poisson B 03/04/2018, 9:59 AM

## 2018-03-04 NOTE — Progress Notes (Signed)
PMT consult received and chart reviewed. Met with patient, daughter, and patient's best friend at bedside. Good initial goals of care conversation. Patient very tearful throughout conversation and still trying to process conversation with Dr. Alvy Bimler yesterday. Reviewed in detail MOST form and palliative/hospice approach to care. Patient/daughter wish to pursue radiation to spine if recommended by Dr. Alvy Bimler, understanding palliative in nature. They understand she is not a candidate for chemotherapy.   Prior to hospitalization, patient living home alone. Daughter is not local and unable to provide 24 hour care, therefore safest discharge plan will be SNF under rehab. Daughter agreeable with outpatient palliative follow-up understanding this can transition to hospice at any time. Patient/daughter leaning towards palliative focused care. PMT will f/u and continue to discuss goals of care/advanced directives.   Full palliative note to follow.   NO CHARGE  Ihor Dow, FNP-C Palliative Medicine Team  Phone: [phone] Fax: [phone]

## 2018-03-04 NOTE — Consult Note (Signed)
Consultation Note Date: 03/04/18  Patient Name: Susan Bradford  DOB: 31-Aug-1933  MRN: [id number]  Age / Sex: 84 y.o., female  PCP: Susan Seashore, MD Referring Physician: Tawni Bradford  Reason for Consultation: Establishing goals of care  HPI/Patient Profile: 84 y.o. female  with past medical history of hypertension, hyperlipidemia, ovarian cancer s/p radical resection 11/05/16 admitted on 02/17/2018 with back pain. CT revealed T6 tumor with cord compression. Chest xray revealed lung mass, no pneumonia. On 7/24, patient underwent CT-guided biopsy of omentum which confirmed recurrent metastatic disease in her pelvis. On 7/26, patient underwent epidural tumor resection with pathology confirming metastatic, recurrent granulosa cell tumor. Oncology following and met with patient/family on 7/30 to discuss poor prognosis. She is currently no strong enough for palliative chemotherapy. PT recommending SNF for rehab. Palliative medicine consultation for goals of care and pain management.   Clinical Assessment and Goals of Care:  I have reviewed medical records, discussed with care team, and met with patient, daughter Susan Bradford), and patient's best friend of 37 years Susan Bradford) at bedside to discuss diagnosis, prognosis, GOC, EOL wishes, disposition and options.  Introduced Palliative Medicine as specialized medical care for people living with serious illness. It focuses on providing relief from the symptoms and stress of a serious illness. The goal is to improve quality of life for both the patient and the family.  We discussed a brief life review of the patient. Prior to admission, patient living home independently. She has two adult children, one in [place] and one in [place], [place]). Patient enjoys volunteering for her church and loves to crochet blankets and donate to hospice patients. Appetite has  been fair.   Susan Bradford recalls her previous accidental diagnosis of ovarian cancer in February 2018. She required resection but did not need chemotherapy. Patient and family were hopeful that if ovarian cancer reoccurred, it would be many years later. Susan Bradford and Susan Bradford are still trying to process conversation with Susan Bradford yesterday and still "shocked" to hear of poor prognosis. Patient respectfully tearful throughout the conversation. She is afraid of the "unknown."   Discussed in detail diagnoses, interventions, and poor prognosis with progressive cancer. Patient and daughter understand she is not a candidate for chemotherapy. Susan Bradford asks about palliative radiation. I explained that this decision would be up to Susan Bradford.   I attempted to elicit values and goals of care important to the patient. Advanced directives, concepts specific to code status, artifical feeding and hydration, and rehospitalization were considered and discussed. Patient has a documented living will and HCPOA paperwork scanned into epic. Susan Bradford is documented HCPOA. Introduced and reviewed MOST form in detail. Educated on medical recommendation against heroic measures at EOL with underlying cancer, frailty, and poor outcomes of cpr.   Patient and daughter are not ready to make decisions today but patient leaning towards focusing on palliative care with goal of comfort, quality, and dignity at EOL. The difference between aggressive medical intervention and comfort care was considered in light of the patient's goals of care.  Hospice and Palliative Care services outpatient were explained and offered. Patient came from home alone. At this point, safest option for discharge will be SNF to attempt rehab. Susan Bradford understands palliative can transition to hospice services at any point.   Questions and concerns were addressed.  Hard Choices booklet left for review. PMT contact information given.   Therapeutic listening and emotional/spiritual  support provided as patient shares pictures of her family and beautiful crocheted blankets.    SUMMARY OF RECOMMENDATIONS    Good initial palliative discussion with patient and daughter. Patient and daughter have a good understanding of diagnoses and poor prognosis. They understand she is NOT a candidate for chemotherapy.   Patient/daughter interested in palliative radiation to spine if recommended by Susan Bradford.   Introduced MOST form and educated on medical recommendation against heroic measures at EOL with underlying cancer.   Patient has documented living will that is scanned into epic. Daughter, Susan Bradford is documented HCPOA.   Patient/daughter interested in outpatient palliative services at SNF. Susan Bradford understands this can transition to hospice services in the near future.   Code Status/Advance Care Planning:  Full code  Symptom Management:   Acetaminophen 670m PO q8h scheduled  Continue Norco 1 tab q6h prn pain  Continue Tramadol 569mPO q6h prn pain  Continue Ativan 53m83mO q8h prn anxiety  Continue Ambien 5mg753m HS PRN  Palliative Prophylaxis:   Aspiration, Bowel Regimen, Delirium Protocol, Frequent Pain Assessment, Oral Care and Turn Reposition  Psycho-social/Spiritual:   Desire for further Chaplaincy support:yes  Additional Recommendations: Caregiving  Support/Resources and Education on Hospice  Prognosis:   Unable to determine: poor prognosis with metastatic ovarian cancer.   Discharge Planning: To Be Determined likely SNF for rehab with palliative     Primary Diagnoses: Present on Admission: . Ovarian cancer, unspecified laterality (HCC)[place] due to malignant neoplasm metastatic to spine (HCC[place] . Ovarian cancer (HCC)[place] have reviewed the medical record, interviewed the patient and family, and examined the patient. The following aspects are pertinent.  Past Medical History:    Diagnosis Date  . Cancer (HCC)[place] ovarian   . High cholesterol   . Hypertension    Social History   Socioeconomic History  . Marital status: Widowed    Spouse name: Not on file  . Number of children: Not on file  . Years of education: Not on file  . Highest education level: Not on file  Occupational History  . Not on file  Social Needs  . Financial resource strain: Not on file  . Food insecurity:    Worry: Not on file    Inability: Not on file  . Transportation needs:    Medical: Not on file    Non-medical: Not on file  Tobacco Use  . Smoking status: Former Smoker    Packs/day: 1.00    Years: 50.00    Pack years: 50.00    Types: Cigarettes    Last attempt to quit: 02/28/2001    Years since quitting: 17.0  . Smokeless tobacco: Never Used  Substance and Sexual Activity  . Alcohol use: Yes    Comment: occasionally  . Drug use: No  . Sexual activity: Not Currently  Lifestyle  . Physical activity:    Days per week: Not on file    Minutes per session: Not on file  . Stress: Not on file  Relationships  . Social  connections:    Talks on phone: Not on file    Gets together: Not on file    Attends religious service: Not on file    Active member of club or organization: Not on file    Attends meetings of clubs or organizations: Not on file    Relationship status: Not on file  Other Topics Concern  . Not on file  Social History Narrative  . Not on file   Family History  Problem Relation Age of Onset  . Cancer Sister   . Cancer Brother    Scheduled Meds: . acetaminophen (TYLENOL) oral liquid 160 mg/5 mL  650 mg Oral Q8H  . atorvastatin  80 mg Oral q1800  . bisacodyl  10 mg Rectal Daily  . feeding supplement (ENSURE ENLIVE)  237 mL Oral BID BM  . levothyroxine  50 mcg Oral Daily  . mometasone-formoterol  2 puff Inhalation BID  . senna-docusate  2 tablet Oral BID  . sertraline  50 mg Oral Daily  . sodium chloride flush  3 mL Intravenous Q12H  .  triamterene-hydrochlorothiazide  1 tablet Oral Daily   Continuous Infusions: . sodium chloride    . lactated ringers Stopped (02/28/18 0526)   PRN Meds:.HYDROcodone-acetaminophen, LORazepam, magnesium citrate, naLOXone (NARCAN)  injection, ondansetron **OR** ondansetron (ZOFRAN) IV, sodium chloride flush, traMADol, zolpidem Medications Prior to Admission:  Prior to Admission medications   Medication Sig Start Date End Date Taking? Authorizing Provider  acetaminophen (TYLENOL) 650 MG CR tablet Take 650 mg by mouth daily as needed for pain.   Yes [provider]  atorvastatin (LIPITOR) 80 MG tablet Take 80 mg by mouth daily.   Yes [provider]  budesonide-formoterol (SYMBICORT) 160-4.5 MCG/ACT inhaler Inhale 2 puffs into the lungs 2 (two) times daily. 10/24/16  Yes Rigoberto Noel, MD  cetirizine (ZYRTEC) 10 MG tablet Take 10 mg by mouth daily.   Yes [provider]  diclofenac (VOLTAREN) 75 MG EC tablet Take 75 mg by mouth 2 (two) times daily.    Yes [provider]  ibuprofen (ADVIL,MOTRIN) 200 MG tablet Take 200 mg by mouth every 6 (six) hours as needed.   Yes [provider]  levothyroxine (SYNTHROID, LEVOTHROID) 50 MCG tablet Take 50 mcg by mouth daily. 12/30/17  Yes [provider]  LORazepam (ATIVAN) 0.5 MG tablet Take 1 mg by mouth every 8 (eight) hours.    Yes [provider]  Melatonin CR 3 MG TBCR Take 1 tablet by mouth at bedtime.   Yes [provider]  sertraline (ZOLOFT) 50 MG tablet Take 50 mg by mouth daily. 01/26/18  Yes [provider]  traMADol (ULTRAM) 50 MG tablet Take 50 mg by mouth daily as needed for pain. 02/20/18  Yes [provider]  triamterene-hydrochlorothiazide (DYAZIDE) 37.5-25 MG capsule Take 1 capsule by mouth daily.   Yes [provider]   Allergies  Allergen Reactions  . Oxycodone Other (See Comments)    UNSPECIFIED REACTION  Patient states "I will never take  oxycodone again." Patient states that it was too powerful.  . Hydromorphone Nausea And Vomiting  . Tape Rash    **PAPER TAPE OK**   Review of Systems  Constitutional: Positive for activity change and appetite change.       Mid back pain  Neurological: Positive for weakness.    Physical Exam  Constitutional: She is oriented to person, place, and time. She is cooperative.  HENT:  Head: Normocephalic and atraumatic.  Cardiovascular: Regular rhythm.  Pulmonary/Chest: No accessory muscle usage. No tachypnea. No respiratory distress.  Abdominal: There is tenderness.  Neurological: She is alert and oriented to person, place, and time.  Skin: Skin is warm and dry. There is pallor.  Psychiatric: Her speech is normal and behavior is normal. Cognition and memory are normal. She exhibits a depressed mood.  Nursing note and vitals reviewed.   Vital Signs: BP (!) 111/58 (BP Location: Right Arm)   Pulse 92   Temp 97.8 F (36.6 C) (Oral)   Resp 18   Ht _0  (1.651 m)   Wt 63.6 kg (140 lb 3.4 oz)   SpO2 93%   BMI 23.33 kg/m  Pain Scale: 0-10 POSS *See Group Information*: S-Acceptable,Sleep, easy to arouse Pain Score: Asleep   SpO2: SpO2: 93 % O2 Device:SpO2: 93 % O2 Flow Rate: .O2 Flow Rate (L/min): 1.5 L/min  IO: Intake/output summary:   Intake/Output Summary (Last 24 hours) at 03/05/2018 0854 Last data filed at 03/05/2018 0457 Gross per 24 hour  Intake 390 ml  Output 800 ml  Net -410 ml    LBM: Last BM Date: 03/04/18 Baseline Weight: Weight: 63.6 kg (140 lb 3.4 oz) Most recent weight: Weight: 63.6 kg (140 lb 3.4 oz)     Palliative Assessment/Data: PPS 50%   Flowsheet Rows     Most Recent Value  Intake Tab  Referral Department  Hospitalist  Unit at Time of Referral  Med/Surg Unit  Palliative Care Primary Diagnosis  Cancer  Palliative Care Type  New Palliative care  Reason for referral  Clarify Goals of Care  Date first seen by Palliative Care  03/04/18  Clinical  Assessment  Palliative Performance Scale Score  50%  Psychosocial & Spiritual Assessment  Palliative Care Outcomes  Patient/Family meeting held?  Yes  Who was at the meeting?  patient, daughter, friend  Palliative Care Outcomes  Clarified goals of care, ACP counseling assistance, Provided psychosocial or spiritual support, Counseled regarding hospice, Provided end of life care assistance, Improved pain interventions, Improved non-pain symptom therapy, Linked to palliative care logitudinal support       Time In: 0950 Time Out: 1150 Time Total: 186mn Greater than 50%  of this time was spent counseling and coordinating care related to the above assessment and plan.  Signed by:  MIhor Dow FNP-C Palliative Medicine Team  Phone: [phone]: [phone]  Please contact Palliative Medicine Team phone at [phone] questions and concerns.  For individual provider: See AShea Evans

## 2018-03-04 NOTE — Progress Notes (Signed)
PROGRESS NOTE    Susan Bradford  FBP:[id number] DOB: 03-27-1934 DOA: 02/25/2018 PCP: Merrilee Seashore, MD    Brief Narrative:  84 year old female who presented with back pain.  She does have significant past medical history for ovarian cancer.  Reported worsening back pain, severe intensity refractive to outpatient management.  Initial physical examination with blood pressure 168/76, heart rate 67, respiratory rate 20, temperature is 97.6, oxygen saturation 94%, moist mucous membranes, lungs clear to auscultation bilaterally, heart S1-S2 present rhythmic, abdomen soft nontender, no lower extremity edema.  CT chest with multiple pulmonary nodules, expansible mass left T6 vertebral body consistent with tumor resulting in severe canal stenosis and cord compression.  4.1 cm aneurysmal ascending aorta.  Chest x-ray with left pulmonary nodules.  EKG with normal sinus rhythm.  Patient was admitted to the hospital with the working diagnosis of T6 cord compression related to metastatic ovarian cancer.   Assessment & Plan:   Principal Problem:   Spinal cord compression due to malignant neoplasm metastatic to spine [facility]) Active Problems:   Ovarian cancer, unspecified laterality ([place])   Metastatic cancer to spine (HCC)   Back pain   Ovarian cancer ([place])  1. T6 cord compression sp epidural tumor resection 07/26. Will continue pain control and physical therapy evaluation, will follow up as outpatient.   2. Metastatic ovarian cancer. 11/2016. Multiple intra-abdominal metastasis, will continue pain control and supportive medical therapy. Will need outpatient follow up with Oncology.   3. HTN. Continue blood pressure control with triamterene and hctz.   4. Hypothyroid. Continue levothyroxine.   5. COPD. Stable with no signs of exacerbation.   6. Depression/ anxiety. Will continue sertraline 50 mg daily.    DVT prophylaxis: enoxaparin   Code Status: full Family Communication: no family at the  bedside  Disposition Plan/ discharge barriers:  SNF when bed available.    Consultants:   Oncology   Neurosurgery   Procedures:     Antimicrobials:       Subjective: Patient is tolerating po well, no nausea or vomiting, has been out of bed and ambulating with physical therapy.   Objective: Vitals:   03/03/18 2328 03/04/18 0403 03/04/18 0741 03/04/18 1139  BP: 126/60 123/62 (!) 121/58 128/69  Pulse: 86 70 69 70  Resp: 19 18 17 17   Temp: 97.8 F (36.6 C) 97.8 F (36.6 C) 99 F (37.2 C) 98.8 F (37.1 C)  TempSrc: Oral Oral Oral Oral  SpO2: 98% 97% 97% 97%  Weight:      Height:        Intake/Output Summary (Last 24 hours) at 03/04/2018 1259 Last data filed at 03/04/2018 8242 Gross per 24 hour  Intake 1020 ml  Output 1200 ml  Net -180 ml   Filed Weights   02/24/18 1113 02/22/2018 1135  Weight: 63.6 kg (140 lb 3.4 oz) 63.6 kg (140 lb 3.4 oz)    Examination:   General: deconditioned  Neurology: Awake and alert, non focal  E ENT: mild pallor, no icterus, oral mucosa moist Cardiovascular: No JVD. S1-S2 present, rhythmic, no gallops, rubs, or murmurs. No lower extremity edema. Pulmonary: decreased breath sounds bilaterally, adequate air movement, no wheezing, rhonchi or rales. Gastrointestinal. Abdomen with no organomegaly, non tender, no rebound or guarding Skin. No rashes Musculoskeletal: no joint deformities     Data Reviewed: I have personally reviewed following labs and imaging studies  CBC: Recent Labs  Lab 02/28/18 0436 03/01/18 0638 03/02/18 0644 03/03/18 0503 03/04/18 0532  WBC 11.0* 11.2*  11.7* 8.3 7.2  NEUTROABS 8.6* 9.3* 9.8* 6.5 5.4  HGB 11.9* 11.0* 10.7* 9.3* 8.2*  HCT 38.1 34.2* 33.2* 27.8* 25.1*  MCV 91.1 89.1 89.0 89.1 89.6  PLT 246 221 206 189 606   Basic Metabolic Panel: Recent Labs  Lab 02/28/18 0436 03/01/18 0638 03/02/18 0644 03/03/18 0503 03/04/18 0532  NA 133* 131* 133* 131* 130*  K 4.4 3.7 3.5 3.0* 3.7  CL 92* 90*  91* 93* 92*  CO2 30 34* 31 31 31   GLUCOSE 102* 130* 120* 128* 102*  BUN 17 16 24* 27* 26*  CREATININE 0.75 0.88 0.91 0.77 0.82  CALCIUM 11.1* 10.9* 10.8* 10.2 10.1   GFR: Estimated Creatinine Clearance: 48.4 mL/min (by C-G formula based on SCr of 0.82 mg/dL). Liver Function Tests: No results for input(s): AST, ALT, ALKPHOS, BILITOT, PROT, ALBUMIN in the last 168 hours. No results for input(s): LIPASE, AMYLASE in the last 168 hours. No results for input(s): AMMONIA in the last 168 hours. Coagulation Profile: No results for input(s): INR, PROTIME in the last 168 hours. Cardiac Enzymes: No results for input(s): CKTOTAL, CKMB, CKMBINDEX, TROPONINI in the last 168 hours. BNP (last 3 results) No results for input(s): PROBNP in the last 8760 hours. HbA1C: No results for input(s): HGBA1C in the last 72 hours. CBG: No results for input(s): GLUCAP in the last 168 hours. Lipid Profile: No results for input(s): CHOL, HDL, LDLCALC, TRIG, CHOLHDL, LDLDIRECT in the last 72 hours. Thyroid Function Tests: No results for input(s): TSH, T4TOTAL, FREET4, T3FREE, THYROIDAB in the last 72 hours. Anemia Panel: No results for input(s): VITAMINB12, FOLATE, FERRITIN, TIBC, IRON, RETICCTPCT in the last 72 hours.    Radiology Studies: I have reviewed all of the imaging during this hospital visit personally     Scheduled Meds: . acetaminophen (TYLENOL) oral liquid 160 mg/5 mL  650 mg Oral Q8H  . atorvastatin  80 mg Oral q1800  . bisacodyl  10 mg Rectal Daily  . feeding supplement (ENSURE ENLIVE)  237 mL Oral BID BM  . levothyroxine  50 mcg Oral Daily  . mometasone-formoterol  2 puff Inhalation BID  . senna-docusate  1 tablet Oral BID  . sertraline  50 mg Oral Daily  . sodium chloride flush  3 mL Intravenous Q12H  . triamterene-hydrochlorothiazide  1 tablet Oral Daily   Continuous Infusions: . sodium chloride    . lactated ringers Stopped (02/28/18 0526)     LOS: 8 days         Mauricio Gerome Apley, MD [facility] Pager [phone]

## 2018-03-05 ENCOUNTER — Telehealth: Payer: Self-pay | Admitting: Oncology

## 2018-03-05 DIAGNOSIS — D497 Neoplasm of unspecified behavior of endocrine glands and other parts of nervous system: Secondary | ICD-10-CM

## 2018-03-05 DIAGNOSIS — C569 Malignant neoplasm of unspecified ovary: Secondary | ICD-10-CM

## 2018-03-05 DIAGNOSIS — J432 Centrilobular emphysema: Secondary | ICD-10-CM

## 2018-03-05 DIAGNOSIS — G959 Disease of spinal cord, unspecified: Secondary | ICD-10-CM

## 2018-03-05 DIAGNOSIS — Z7189 Other specified counseling: Secondary | ICD-10-CM

## 2018-03-05 DIAGNOSIS — F411 Generalized anxiety disorder: Secondary | ICD-10-CM

## 2018-03-05 DIAGNOSIS — Z515 Encounter for palliative care: Secondary | ICD-10-CM

## 2018-03-05 LAB — CBC WITH DIFFERENTIAL/PLATELET
Abs Immature Granulocytes: 0.1 10*3/uL (ref 0.0–0.1)
Basophils Absolute: 0 10*3/uL (ref 0.0–0.1)
Basophils Relative: 1 %
Eosinophils Absolute: 0.2 10*3/uL (ref 0.0–0.7)
Eosinophils Relative: 3 %
HEMATOCRIT: 24.3 % — AB (ref 36.0–46.0)
Hemoglobin: 7.9 g/dL — ABNORMAL LOW (ref 12.0–15.0)
IMMATURE GRANULOCYTES: 1 %
LYMPHS ABS: 0.4 10*3/uL — AB (ref 0.7–4.0)
LYMPHS PCT: 5 %
MCH: 28.7 pg (ref 26.0–34.0)
MCHC: 32.5 g/dL (ref 30.0–36.0)
MCV: 88.4 fL (ref 78.0–100.0)
Monocytes Absolute: 1.2 10*3/uL — ABNORMAL HIGH (ref 0.1–1.0)
Monocytes Relative: 16 %
NEUTROS ABS: 5.7 10*3/uL (ref 1.7–7.7)
Neutrophils Relative %: 74 %
Platelets: 196 10*3/uL (ref 150–400)
RBC: 2.75 MIL/uL — AB (ref 3.87–5.11)
RDW: 13.3 % (ref 11.5–15.5)
WBC: 7.7 10*3/uL (ref 4.0–10.5)

## 2018-03-05 NOTE — Progress Notes (Signed)
Patient ID: Susan Bradford, female   DOB: [birth year], 84 y.o.   MRN: [id number] BP (!) 106/55 (BP Location: Right Arm)   Pulse 76   Temp 98.8 F (37.1 C) (Oral)   Resp 18   Ht 5\' 5"  (1.651 m)   Wt 63.6 kg (140 lb 3.4 oz)   SpO2 90%   BMI 23.33 kg/m  Alert and oriented x 4. Moving all extremities  Wound healing well, no signs of infection Due to see Susan Bradford next week on the 8th, will order myelogram and post myelogram ct for Monday Agree with spinal irradiation for pain control.

## 2018-03-05 NOTE — Progress Notes (Signed)
Physical Therapy Treatment Patient Details Name: Susan Bradford MRN: [id number] DOB: 09-Jun-1934 Today's Date: 03/05/2018    History of Present Illness 84 year old with past medical history relevant for hypertension, hyperlipidemia, hypothyroidism, depression/anxiety, asthma/COPD, ovarian cancer status post resection in April 2018 who was admitted on 02/22/2018 for severe back pain and found to have widely metastatic disease (likely ovarian as primary) and T6 tumor cord compression. Patient now s/p Thoracic 6 to Thoracic 10 fusion with pedicle screws/posterolateral arthrodesis/tumor resection at Thoracic 8 03/04/18 Pending palliative consult dx of Stage IV cancer provided on 7/30 per notes    PT Comments    [name] doing well today - agreeable to participate with PT. Does continue to require physical assist for bed mobility and transfers. Sit to stand x 4 today from bed and low BSC. Reports abdominal discomfort throughout session. Encouraged patient to ambulate for progressive distances today, but limited due to abdominal/back pain. Will continue to follow.     Follow Up Recommendations  SNF;Supervision/Assistance - 24 hour     Equipment Recommendations  (TBD)    Recommendations for Other Services       Precautions / Restrictions Precautions Precautions: Back Precaution Comments: cues for back precautions Restrictions Weight Bearing Restrictions: No    Mobility  Bed Mobility Overal bed mobility: Needs Assistance Bed Mobility: Rolling;Sidelying to Sit Rolling: Min guard Sidelying to sit: Mod assist       General bed mobility comments: Mod A for trunk control and upright posture from supine  Transfers Overall transfer level: Needs assistance Equipment used: Rolling walker (2 wheeled) Transfers: Sit to/from Stand Sit to Stand: Min assist;Mod assist;+2 physical assistance         General transfer comment: Min/Mod +2 for sit to stand transfers x 4 (from bedside and BSC)  verbal cueing for hand placement  Ambulation/Gait Ambulation/Gait assistance: Min assist;+2 safety/equipment Gait Distance (Feet): 15 Feet Assistive device: Rolling walker (2 wheeled) Gait Pattern/deviations: Step-to pattern;Decreased stride length;Shuffle Gait velocity: decreased   General Gait Details: motivation to perform activity; very short shuffled steps requiring verbal cueing to increased stridelength   Stairs             Wheelchair Mobility    Modified Rankin (Stroke Patients Only)       Balance Overall balance assessment: Needs assistance Sitting-balance support: Feet supported;Bilateral upper extremity supported Sitting balance-Leahy Scale: Fair     Standing balance support: Bilateral upper extremity supported;During functional activity Standing balance-Leahy Scale: Poor Standing balance comment: reliance on UE support                            Cognition Arousal/Alertness: Awake/alert Behavior During Therapy: WFL for tasks assessed/performed Overall Cognitive Status: Within Functional Limits for tasks assessed                                        Exercises      General Comments        Pertinent Vitals/Pain Pain Assessment: Faces Faces Pain Scale: Hurts whole lot Pain Location: back Pain Descriptors / Indicators: Guarding;Moaning;Aching Pain Intervention(s): Limited activity within patient's tolerance;Monitored during session;Repositioned;Patient requesting pain meds-RN notified    Home Living                      Prior Function  PT Goals (current goals can now be found in the care plan section) Acute Rehab PT Goals Patient Stated Goal: none stated PT Goal Formulation: With patient Time For Goal Achievement: 03/14/18 Potential to Achieve Goals: Good Progress towards PT goals: Progressing toward goals    Frequency    Min 5X/week      PT Plan Current plan remains appropriate     Co-evaluation              AM-PAC PT "6 Clicks" Daily Activity  Outcome Measure  Difficulty turning over in bed (including adjusting bedclothes, sheets and blankets)?: Unable Difficulty moving from lying on back to sitting on the side of the bed? : Unable Difficulty sitting down on and standing up from a chair with arms (e.g., wheelchair, bedside commode, etc,.)?: Unable Help needed moving to and from a bed to chair (including a wheelchair)?: A Little Help needed walking in hospital room?: A Little Help needed climbing 3-5 steps with a railing? : Total 6 Click Score: 10    End of Session Equipment Utilized During Treatment: Oxygen Activity Tolerance: Patient tolerated treatment well;Patient limited by pain Patient left: in chair;with call bell/phone within reach;with chair alarm set Nurse Communication: Mobility status PT Visit Diagnosis: Muscle weakness (generalized) (M62.81);Unsteadiness on feet (R26.81);Difficulty in walking, not elsewhere classified (R26.2);Other symptoms and signs involving the nervous system (R29.898)     Time: 1010-1037 PT Time Calculation (min) (ACUTE ONLY): 27 min  Charges:  $Gait Training: 8-22 mins $Therapeutic Activity: 8-22 mins                     Lanney Gins, PT, DPT 03/05/18 11:59 AM Pager: [phone]

## 2018-03-05 NOTE — Progress Notes (Signed)
PROGRESS NOTE    Susan Bradford  BJY:[id number] DOB: 1933/10/03 DOA: 02/26/2018 PCP: Merrilee Seashore, MD    Brief Narrative:  84 year old female who presented with back pain.  She does have significant past medical history for ovarian cancer.  Reported worsening back pain, severe intensity refractive to outpatient management.  Initial physical examination with blood pressure 168/76, heart rate 67, respiratory rate 20, temperature is 97.6, oxygen saturation 94%, moist mucous membranes, lungs clear to auscultation bilaterally, heart S1-S2 present rhythmic, abdomen soft nontender, no lower extremity edema.  CT chest with multiple pulmonary nodules, expansible mass left T6 vertebral body consistent with tumor resulting in severe canal stenosis and cord compression.  4.1 cm aneurysmal ascending aorta.  Chest x-ray with left pulmonary nodules.  EKG with normal sinus rhythm.  Patient was admitted to the hospital with the working diagnosis of T6 cord compression related to metastatic ovarian cancer.  Assessment & Plan:   Principal Problem:   Spinal cord compression due to malignant neoplasm metastatic to spine [facility]) Active Problems:   Ovarian cancer, unspecified laterality ([place])   Metastatic cancer to spine (HCC)   Back pain   Ovarian cancer [facility])   Palliative care by specialist   Goals of care, counseling/discussion   Anxiety state   1. T6 cord compression sp epidural tumor resection 07/26. Pain control with acetaminophen, hydrocodone, and tramadol, continue physical therapy, will need SNF. Patient will need spine irradiation for pain control.   2. Metastatic ovarian cancer. 11/2016. multiple intra-abdominal metastasis. Palliative care, poor prognosis, no nausea or vomiting, able to tolerate po well.   3. HTN. On triamterene and hctz.   4. Hypothyroid. On levothyroxine.   5. COPD. No clinical signs of acute exacerbation. Continue dulera.   6. Depression/ anxiety. On sertraline  50 mg daily.As needed lorazepam, no agitation or confusion.     DVT prophylaxis: enoxaparin   Code Status: full Family Communication: no family at the bedside  Disposition Plan/ discharge barriers:  SNF when bed available.    Consultants:   Oncology   Neurosurgery   Procedures:     Antimicrobials:      Subjective: Patient continue to have lower back pain, moderate to severe, worse with movement and improved with analgesics, no radiation.   Objective: Vitals:   03/04/18 2303 03/05/18 0258 03/05/18 0850 03/05/18 1135  BP: (!) 112/49 128/61 (!) 111/58 (!) 106/55  Pulse: 72 65 92 76  Resp:   18 18  Temp: 98.6 F (37 C) 97.7 F (36.5 C) 97.8 F (36.6 C) 98.8 F (37.1 C)  TempSrc: Oral Oral Oral Oral  SpO2: 96% 98% 93% 90%  Weight:      Height:        Intake/Output Summary (Last 24 hours) at 03/05/2018 1511 Last data filed at 03/05/2018 0457 Gross per 24 hour  Intake 390 ml  Output 800 ml  Net -410 ml   Filed Weights   02/24/18 1113 02/22/2018 1135  Weight: 63.6 kg (140 lb 3.4 oz) 63.6 kg (140 lb 3.4 oz)    Examination:   General: deconditioned and in pain Neurology: Awake and alert, non focal  E ENT: mild pallor, no icterus, oral mucosa moist Cardiovascular: No JVD. S1-S2 present, rhythmic, no gallops, rubs, or murmurs. No lower extremity edema. Pulmonary: vesicular breath sounds bilaterally, adequate air movement, no wheezing, rhonchi or rales. Gastrointestinal. Abdomen with no organomegaly, non tender, no rebound or guarding Skin. No rashes Musculoskeletal: no joint deformities     Data Reviewed:  I have personally reviewed following labs and imaging studies  CBC: Recent Labs  Lab 03/01/18 0638 03/02/18 0644 03/03/18 0503 03/04/18 0532 03/05/18 0520  WBC 11.2* 11.7* 8.3 7.2 7.7  NEUTROABS 9.3* 9.8* 6.5 5.4 5.7  HGB 11.0* 10.7* 9.3* 8.2* 7.9*  HCT 34.2* 33.2* 27.8* 25.1* 24.3*  MCV 89.1 89.0 89.1 89.6 88.4  PLT 221 206 189 192 045    Basic Metabolic Panel: Recent Labs  Lab 02/28/18 0436 03/01/18 0638 03/02/18 0644 03/03/18 0503 03/04/18 0532  NA 133* 131* 133* 131* 130*  K 4.4 3.7 3.5 3.0* 3.7  CL 92* 90* 91* 93* 92*  CO2 30 34* 31 31 31   GLUCOSE 102* 130* 120* 128* 102*  BUN 17 16 24* 27* 26*  CREATININE 0.75 0.88 0.91 0.77 0.82  CALCIUM 11.1* 10.9* 10.8* 10.2 10.1   GFR: Estimated Creatinine Clearance: 48.4 mL/min (by C-G formula based on SCr of 0.82 mg/dL). Liver Function Tests: No results for input(s): AST, ALT, ALKPHOS, BILITOT, PROT, ALBUMIN in the last 168 hours. No results for input(s): LIPASE, AMYLASE in the last 168 hours. No results for input(s): AMMONIA in the last 168 hours. Coagulation Profile: No results for input(s): INR, PROTIME in the last 168 hours. Cardiac Enzymes: No results for input(s): CKTOTAL, CKMB, CKMBINDEX, TROPONINI in the last 168 hours. BNP (last 3 results) No results for input(s): PROBNP in the last 8760 hours. HbA1C: No results for input(s): HGBA1C in the last 72 hours. CBG: No results for input(s): GLUCAP in the last 168 hours. Lipid Profile: No results for input(s): CHOL, HDL, LDLCALC, TRIG, CHOLHDL, LDLDIRECT in the last 72 hours. Thyroid Function Tests: No results for input(s): TSH, T4TOTAL, FREET4, T3FREE, THYROIDAB in the last 72 hours. Anemia Panel: No results for input(s): VITAMINB12, FOLATE, FERRITIN, TIBC, IRON, RETICCTPCT in the last 72 hours.    Radiology Studies: I have reviewed all of the imaging during this hospital visit personally     Scheduled Meds: . acetaminophen (TYLENOL) oral liquid 160 mg/5 mL  650 mg Oral Q8H  . atorvastatin  80 mg Oral q1800  . bisacodyl  10 mg Rectal Daily  . feeding supplement (ENSURE ENLIVE)  237 mL Oral BID BM  . levothyroxine  50 mcg Oral Daily  . mometasone-formoterol  2 puff Inhalation BID  . senna-docusate  2 tablet Oral BID  . sertraline  50 mg Oral Daily  . sodium chloride flush  3 mL Intravenous Q12H   . triamterene-hydrochlorothiazide  1 tablet Oral Daily   Continuous Infusions: . sodium chloride    . lactated ringers Stopped (02/28/18 0526)     LOS: 9 days        Takaya Hyslop Gerome Apley, MD [facility] Pager [phone]

## 2018-03-05 NOTE — Progress Notes (Signed)
CSW following for discharge plan. CSW spoke with patient's daughter and discussed preference for [facility], as [facility] has declined to offer a bed for the patient. Patient and daughter are happy with choice of [facility].   Patient is pursuing radiation treatments at discharge, and CSW confirmed that [facility] would be able to assist with transportation to and from [place] for patient's radiation treatments. CSW provided information to patient's daughter.  Patient's daughter indicated that she is planning on heading back to her home in [place] this evening to handle some things back home, but she will be available via phone and will try to come back to [place] as soon as possible.   CSW to continue to follow.  Laveda Abbe, [place] Clinical Social Worker [phone]

## 2018-03-05 NOTE — Progress Notes (Signed)
Daily Progress Note   Patient Name: Susan Bradford       Date: 03/05/2018 DOB: 1933/10/12  Age: 84 y.o. MRN#: [id number] Attending Physician: Tawni Millers Primary Care Physician: Merrilee Seashore, MD Admit Date: 02/03/2018  Reason for Consultation/Follow-up: Establishing goals of care  Subjective: Patient sitting up in chair this afternoon. Complains of back pain and requesting to get back in bed. Notified nursing staff.   Patient is happy that her pastor visited with her. Daughter is not at bedside this afternoon. She is running errands. Patient and daughter have not yet reviewed MOST form. Patient does not have questions or concerns for me at this time.  Length of Stay: 9  Current Medications: Scheduled Meds:  . acetaminophen (TYLENOL) oral liquid 160 mg/5 mL  650 mg Oral Q8H  . atorvastatin  80 mg Oral q1800  . bisacodyl  10 mg Rectal Daily  . feeding supplement (ENSURE ENLIVE)  237 mL Oral BID BM  . levothyroxine  50 mcg Oral Daily  . mometasone-formoterol  2 puff Inhalation BID  . senna-docusate  2 tablet Oral BID  . sertraline  50 mg Oral Daily  . sodium chloride flush  3 mL Intravenous Q12H  . triamterene-hydrochlorothiazide  1 tablet Oral Daily    Continuous Infusions: . sodium chloride    . lactated ringers Stopped (02/28/18 0526)    PRN Meds: HYDROcodone-acetaminophen, LORazepam, magnesium citrate, naLOXone (NARCAN)  injection, ondansetron **OR** ondansetron (ZOFRAN) IV, sodium chloride flush, traMADol, zolpidem  Physical Exam  Constitutional: She is oriented to person, place, and time. She is cooperative.  HENT:  Head: Normocephalic and atraumatic.  Cardiovascular: Regular rhythm.  Pulmonary/Chest: No accessory muscle usage. No tachypnea. No  respiratory distress.  Neurological: She is alert and oriented to person, place, and time.  Skin: Skin is warm and dry. There is pallor.  Psychiatric: She has a normal mood and affect. Her speech is normal and behavior is normal. Cognition and memory are normal.  Nursing note and vitals reviewed.          Vital Signs: BP (!) 106/55 (BP Location: Right Arm)   Pulse 76   Temp 98.8 F (37.1 C) (Oral)   Resp 18   Ht '5\' 5"'$  (1.651 m)   Wt 63.6 kg (140 lb 3.4 oz)   SpO2 90%  BMI 23.33 kg/m  SpO2: SpO2: 90 % O2 Device: O2 Device: Room Air O2 Flow Rate: O2 Flow Rate (L/min): 1.5 L/min  Intake/output summary:   Intake/Output Summary (Last 24 hours) at 03/05/2018 1217 Last data filed at 03/05/2018 0457 Gross per 24 hour  Intake 390 ml  Output 800 ml  Net -410 ml   LBM: Last BM Date: 03/04/18 Baseline Weight: Weight: 63.6 kg (140 lb 3.4 oz) Most recent weight: Weight: 63.6 kg (140 lb 3.4 oz)       Palliative Assessment/Data: PPS 50%   Flowsheet Rows     Most Recent Value  Intake Tab  Referral Department  Hospitalist  Unit at Time of Referral  Med/Surg Unit  Palliative Care Primary Diagnosis  Cancer  Palliative Care Type  New Palliative care  Reason for referral  Clarify Goals of Care  Date first seen by Palliative Care  03/04/18  Clinical Assessment  Palliative Performance Scale Score  50%  Psychosocial & Spiritual Assessment  Palliative Care Outcomes  Patient/Family meeting held?  Yes  Who was at the meeting?  patient, daughter, friend  Palliative Care Outcomes  Clarified goals of care, ACP counseling assistance, Provided psychosocial or spiritual support, Counseled regarding hospice, Provided end of life care assistance, Improved pain interventions, Improved non-pain symptom therapy, Linked to palliative care logitudinal support      Patient Active Problem List   Diagnosis Date Noted  . Palliative care by specialist   . Goals of care, counseling/discussion   . Anxiety  state   . Ovarian cancer ([place]) 02/22/2018  . Ovarian cancer, unspecified laterality ([place]) 02/24/2018  . Metastatic cancer to spine ([place]) 02/02/2018  . Spinal cord compression due to malignant neoplasm metastatic to spine ([place]) 02/12/2018  . Back pain 03/04/2018  . COPD (chronic obstructive pulmonary disease) ([place]) 02/29/2016    Palliative Care Assessment & Plan   Patient Profile: 84 y.o. female  with past medical history of hypertension, hyperlipidemia, ovarian cancer s/p radical resection 11/05/16 admitted on 02/04/2018 with back pain. CT revealed T6 tumor with cord compression. Chest xray revealed lung mass, no pneumonia. On 7/24, patient underwent CT-guided biopsy of omentum which confirmed recurrent metastatic disease in her pelvis. On 7/26, patient underwent epidural tumor resection with pathology confirming metastatic, recurrent granulosa cell tumor. Oncology following and met with patient/family on 7/30 to discuss poor prognosis. She is currently no strong enough for palliative chemotherapy. PT recommending SNF for rehab. Palliative medicine consultation for goals of care and pain management.   Assessment: Metastatic ovarian cancer Spinal cord compression due to malignant neoplasm Back pain COPD Depression/anxiety  Recommendations/Plan:  Continue current plan of care.   Patient and daughter have not yet made decisions on MOST form and heroic EOL interventions. Daughter not at bedside this afternoon.  Patient will need SNF for rehab on discharge. Participating with rehab. Palliative services to follow at SNF.  PMT NP will f/u with patient and daughter tomorrow and further discuss MOST form.   Code Status: FULL   Code Status Orders  (From admission, onward)        Start     Ordered   02/24/18 0220  Full code  Continuous     02/24/18 0221    Code Status History    This patient has a current code status but no historical code status.    Advance Directive Documentation       Most Recent Value  Type of Advance Directive  Healthcare Power of [place]  out of facility DNR order (yellow form or pink MOST form)  -  "MOST" Form in Place?  -       Prognosis:   Unable to determine  Discharge Planning:  [facility] for rehab with Palliative care service follow-up  Care plan was discussed with patient  Thank you for allowing the Palliative Medicine Team to assist in the care of this patient.   Time In: 1200 Time Out: 1220 Total Time 61mn Prolonged Time Billed no      Greater than 50%  of this time was spent counseling and coordinating care related to the above assessment and plan.  MIhor Dow FNP-C Palliative Medicine Team  Phone: [phone]: [phone] Please contact Palliative Medicine Team phone at [phone] questions and concerns.

## 2018-03-05 NOTE — Telephone Encounter (Signed)
Called patient's daughter, Susan Bradford, and discussed that radiation will be planned for 2 weeks after her mother's surgery per Shona Simpson, PA-C note on 02/24/18.  This will give her time to heal and regain strength.  Advised her that an outpatient consult appointment with Dr. Lisbeth Renshaw has been scheduled on 03/12/18, 12:30 pm to see the nurse and 1:00 pm to see Dr. Lisbeth Renshaw.  She verbalized understanding and agreement.

## 2018-03-05 DEATH — deceased

## 2018-03-06 DIAGNOSIS — G893 Neoplasm related pain (acute) (chronic): Secondary | ICD-10-CM

## 2018-03-06 DIAGNOSIS — C7800 Secondary malignant neoplasm of unspecified lung: Secondary | ICD-10-CM

## 2018-03-06 LAB — CBC WITH DIFFERENTIAL/PLATELET
Abs Immature Granulocytes: 0.1 10*3/uL (ref 0.0–0.1)
BASOS ABS: 0 10*3/uL (ref 0.0–0.1)
BASOS PCT: 0 %
EOS ABS: 0.2 10*3/uL (ref 0.0–0.7)
Eosinophils Relative: 2 %
HCT: 24.1 % — ABNORMAL LOW (ref 36.0–46.0)
Hemoglobin: 7.8 g/dL — ABNORMAL LOW (ref 12.0–15.0)
IMMATURE GRANULOCYTES: 1 %
Lymphocytes Relative: 6 %
Lymphs Abs: 0.5 10*3/uL — ABNORMAL LOW (ref 0.7–4.0)
MCH: 28.6 pg (ref 26.0–34.0)
MCHC: 32.4 g/dL (ref 30.0–36.0)
MCV: 88.3 fL (ref 78.0–100.0)
MONOS PCT: 17 %
Monocytes Absolute: 1.4 10*3/uL — ABNORMAL HIGH (ref 0.1–1.0)
NEUTROS ABS: 6 10*3/uL (ref 1.7–7.7)
NEUTROS PCT: 74 %
PLATELETS: 236 10*3/uL (ref 150–400)
RBC: 2.73 MIL/uL — ABNORMAL LOW (ref 3.87–5.11)
RDW: 13.4 % (ref 11.5–15.5)
WBC: 8.1 10*3/uL (ref 4.0–10.5)

## 2018-03-06 MED ORDER — FENTANYL 12 MCG/HR TD PT72
12.5000 ug | MEDICATED_PATCH | TRANSDERMAL | Status: DC
  Administered 2018-03-06: 12.5 ug via TRANSDERMAL
  Filled 2018-03-06: qty 1

## 2018-03-06 NOTE — Progress Notes (Signed)
PROGRESS NOTE    Susan Bradford  AYT:[id number] DOB: 1934/06/13 DOA: 02/19/2018 PCP: Merrilee Seashore, MD    Brief Narrative:  84 year old female who presented with back pain. She does have significant past medical history for ovarian cancer.Reported worsening back pain, severe intensity refractive to outpatient management. Initial physical examination with blood pressure 168/76, heart rate 67, respiratory rate20, temperature is97.6, oxygen saturation 94%, moist mucous membranes, lungs clear to auscultation bilaterally, heart S1-S2 present rhythmic, abdomen soft nontender, no lower extremity edema. CT chest with multiple pulmonary nodules, expansible mass left T6 vertebral body consistent with tumor resulting in severe canal stenosis and cord compression. 4.1 cm aneurysmal ascending aorta. Chest x-ray with left pulmonary nodules. EKG with normal sinus rhythm.  Patient was admitted to the hospital with the working diagnosis of T6 cord compression related to metastatic ovarian cancer.   Assessment & Plan:   Principal Problem:   Spinal cord compression due to malignant neoplasm metastatic to spine [facility]) Active Problems:   Ovarian cancer, unspecified laterality ([place])   Metastatic cancer to spine (HCC)   Back pain   Ovarian cancer [facility])   Palliative care by specialist   Goals of care, counseling/discussion   Anxiety state   1. T6 cord compression sp epidural tumor resection 07/26. Will continue pain control with acetaminophen, hydrocodone, and tramadol. Patient will get outpatient palliative radiation for pain control. Scheduled for myelogram on 08/05. Out of bed to the chair as tolerated.   2. Metastatic ovarian cancer. 11/2016. multiple intra-abdominal metastasis. Continue supportive medical therapy, no abdominal pain or ascites, will need palliative care services at discharge.   3. HTN. Continue triamterene and hctz for blood pressure control.    4. Hypothyroid.  Continue with levothyroxine.   5. COPD no exacerbation. Continue dulera.   6. Depression/ anxiety. Continue with sertraline 50 mg daily. PRN lorazepam, no agitation or confusion.     DVT prophylaxis:enoxaparin Code Status:full Family Communication:no family at the bedside Disposition Plan/ discharge barriers:SNF when bed available.   Consultants:  Oncology   Neurosurgery  Procedures:    Antimicrobials:     Subjective: Patient continue to have moderate to sever pain at the lower back, worse with supine position and movement, no radiation, mild improvement with analgesics, no nausea or vomiting.   Objective: Vitals:   03/05/18 2340 03/06/18 0356 03/06/18 0721 03/06/18 1236  BP: 122/68 118/60 121/68 (!) 143/59  Pulse: 75 73 75 84  Resp: 13 13 17 19   Temp: 99.2 F (37.3 C) 98.9 F (37.2 C) 98.4 F (36.9 C) 98.3 F (36.8 C)  TempSrc: Oral Oral Oral Oral  SpO2: 97% 94% 96% 95%  Weight:      Height:        Intake/Output Summary (Last 24 hours) at 03/06/2018 1313 Last data filed at 03/06/2018 0357 Gross per 24 hour  Intake -  Output 1050 ml  Net -1050 ml   Filed Weights   02/24/18 1113 02/10/2018 1135  Weight: 63.6 kg (140 lb 3.4 oz) 63.6 kg (140 lb 3.4 oz)    Examination:   General: Not in pain or dyspnea, deconditioned  Neurology: Awake and alert, non focal  E ENT: mild pallor, no icterus, oral mucosa moist Cardiovascular: No JVD. S1-S2 present, rhythmic, no gallops, rubs, or murmurs. No lower extremity edema. Pulmonary: decreased breath sounds bilaterally at bases, adequate air movement, no wheezing, rhonchi or rales. Gastrointestinal. Abdomen with no organomegaly, non tender, no rebound or guarding Skin. No rashes Musculoskeletal: no joint deformities  Data Reviewed: I have personally reviewed following labs and imaging studies  CBC: Recent Labs  Lab 03/02/18 0644 03/03/18 0503 03/04/18 0532 03/05/18 0520 03/06/18 0546    WBC 11.7* 8.3 7.2 7.7 8.1  NEUTROABS 9.8* 6.5 5.4 5.7 6.0  HGB 10.7* 9.3* 8.2* 7.9* 7.8*  HCT 33.2* 27.8* 25.1* 24.3* 24.1*  MCV 89.0 89.1 89.6 88.4 88.3  PLT 206 189 192 196 010   Basic Metabolic Panel: Recent Labs  Lab 02/28/18 0436 03/01/18 0638 03/02/18 0644 03/03/18 0503 03/04/18 0532  NA 133* 131* 133* 131* 130*  K 4.4 3.7 3.5 3.0* 3.7  CL 92* 90* 91* 93* 92*  CO2 30 34* 31 31 31   GLUCOSE 102* 130* 120* 128* 102*  BUN 17 16 24* 27* 26*  CREATININE 0.75 0.88 0.91 0.77 0.82  CALCIUM 11.1* 10.9* 10.8* 10.2 10.1   GFR: Estimated Creatinine Clearance: 48.4 mL/min (by C-G formula based on SCr of 0.82 mg/dL). Liver Function Tests: No results for input(s): AST, ALT, ALKPHOS, BILITOT, PROT, ALBUMIN in the last 168 hours. No results for input(s): LIPASE, AMYLASE in the last 168 hours. No results for input(s): AMMONIA in the last 168 hours. Coagulation Profile: No results for input(s): INR, PROTIME in the last 168 hours. Cardiac Enzymes: No results for input(s): CKTOTAL, CKMB, CKMBINDEX, TROPONINI in the last 168 hours. BNP (last 3 results) No results for input(s): PROBNP in the last 8760 hours. HbA1C: No results for input(s): HGBA1C in the last 72 hours. CBG: No results for input(s): GLUCAP in the last 168 hours. Lipid Profile: No results for input(s): CHOL, HDL, LDLCALC, TRIG, CHOLHDL, LDLDIRECT in the last 72 hours. Thyroid Function Tests: No results for input(s): TSH, T4TOTAL, FREET4, T3FREE, THYROIDAB in the last 72 hours. Anemia Panel: No results for input(s): VITAMINB12, FOLATE, FERRITIN, TIBC, IRON, RETICCTPCT in the last 72 hours.    Radiology Studies: I have reviewed all of the imaging during this hospital visit personally     Scheduled Meds: . acetaminophen (TYLENOL) oral liquid 160 mg/5 mL  650 mg Oral Q8H  . atorvastatin  80 mg Oral q1800  . bisacodyl  10 mg Rectal Daily  . feeding supplement (ENSURE ENLIVE)  237 mL Oral BID BM  . levothyroxine   50 mcg Oral Daily  . mometasone-formoterol  2 puff Inhalation BID  . senna-docusate  2 tablet Oral BID  . sertraline  50 mg Oral Daily  . sodium chloride flush  3 mL Intravenous Q12H  . triamterene-hydrochlorothiazide  1 tablet Oral Daily   Continuous Infusions: . sodium chloride    . lactated ringers Stopped (02/28/18 0526)     LOS: 10 days        Gaetano Romberger Gerome Apley, MD [facility] Pager [phone]

## 2018-03-06 NOTE — Progress Notes (Signed)
PT Cancellation Note  Patient Details Name: Susan Bradford MRN: [id number] DOB: 1934-06-30   Cancelled Treatment:    Reason Eval/Treat Not Completed: Patient declined, no reason specified Patient tearful upon PT arrival. Stating the she needed to complete paperwork. PT encouraging patient to participate with PT with patient refusing multiple times. Reports back and abdominal pain with RN notified. PT to re-attempt as time allows.   Lanney Gins, PT, DPT 03/06/18 10:54 AM Pager: [phone]

## 2018-03-06 NOTE — Progress Notes (Signed)
Daily Progress Note   Patient Name: Susan Bradford       Date: 03/06/2018 DOB: [birth year]  Age: 84 y.o. MRN#: [id number] Attending Physician: Tawni Millers Primary Care Physician: Merrilee Seashore, MD Admit Date: 02/13/2018  Reason for Consultation/Follow-up: Establishing goals of care  Subjective/GOC: Patient awake, alert, and very tearful this afternoon. She tells me she hasn't been able to nap in two days and also continues to have back pain. She does not want to work with physical therapy today. She is worried about getting "paperwork" done and has MOST form sitting in her lap.   Susan Bradford tells me she is ready to complete MOST form. Included her Susan Bradford, Susan Bradford, on speaker phone while we discussed and completed MOST form. Patient wishes to be "put on palliative care." Patient wishes against heroic measures at EOL including DNR/DNI and NO feeding tube. She does not even wish to be placed on BiPAP or CPAP if indicated. She wishes to focus on her comfort at EOL. MOST form completed. Susan Bradford agrees and respects her mother's decisions.   Patient and daughter do wish to f/u outpatient with radiation oncology. They are hopeful this may help with pain management. Patient and Susan Bradford agreeable to start low dose fentanyl patch.   Discussed outpatient palliative services at SNF. Patient/Susan Bradford understand this can transition to hospice services at any time.   Susan Bradford asks appropriate questions regarding s/s of bowel obstruction with ovarian cancer. Educated on s/s of bowel obstruction. We discussed watchful waiting and if this does happen, they could either come to hospital for testing (but likely not a surgical candidate with metastatic cancer) or focus on comfort at EOL with transition  to hospice home. Susan Bradford confirms understanding of this.   Answered many questions and concerns. Emotional/spiritual support provided.   Length of Stay: 10  Current Medications: Scheduled Meds:  . acetaminophen (TYLENOL) oral liquid 160 mg/5 mL  650 mg Oral Q8H  . atorvastatin  80 mg Oral q1800  . bisacodyl  10 mg Rectal Daily  . feeding supplement (ENSURE ENLIVE)  237 mL Oral BID BM  . fentaNYL  12.5 mcg Transdermal Q72H  . levothyroxine  50 mcg Oral Daily  . mometasone-formoterol  2 puff Inhalation BID  . senna-docusate  2 tablet Oral BID  . sertraline  50 mg Oral  Daily  . sodium chloride flush  3 mL Intravenous Q12H  . triamterene-hydrochlorothiazide  1 tablet Oral Daily    Continuous Infusions: . sodium chloride    . lactated ringers Stopped (02/28/18 0526)    PRN Meds: HYDROcodone-acetaminophen, LORazepam, magnesium citrate, naLOXone (NARCAN)  injection, ondansetron **OR** ondansetron (ZOFRAN) IV, sodium chloride flush, traMADol, zolpidem  Physical Exam  Constitutional: She is oriented to person, place, and time. She is cooperative.  HENT:  Head: Normocephalic and atraumatic.  Cardiovascular: Regular rhythm.  Pulmonary/Chest: No accessory muscle usage. No tachypnea. No respiratory distress.  Neurological: She is alert and oriented to person, place, and time.  Skin: Skin is warm and dry. There is pallor.  Psychiatric: She has a normal mood and affect. Her speech is normal and behavior is normal. Cognition and memory are normal.  Nursing note and vitals reviewed.          Vital Signs: BP (!) 143/59 (BP Location: Right Arm)   Pulse 84   Temp 98.3 F (36.8 C) (Oral)   Resp 19   Ht '5\' 5"'$  (1.651 m)   Wt 63.6 kg (140 lb 3.4 oz)   SpO2 95%   BMI 23.33 kg/m  SpO2: SpO2: 95 % O2 Device: O2 Device: Nasal Cannula O2 Flow Rate: O2 Flow Rate (L/min): 2 L/min  Intake/output summary:   Intake/Output Summary (Last 24 hours) at 03/06/2018 1414 Last data filed at 03/06/2018  0357 Gross per 24 hour  Intake -  Output 1050 ml  Net -1050 ml   LBM: Last BM Date: 03/04/18 Baseline Weight: Weight: 63.6 kg (140 lb 3.4 oz) Most recent weight: Weight: 63.6 kg (140 lb 3.4 oz)       Palliative Assessment/Data: PPS 50%   Flowsheet Rows     Most Recent Value  Intake Tab  Referral Department  Hospitalist  Unit at Time of Referral  Med/Surg Unit  Palliative Care Primary Diagnosis  Cancer  Palliative Care Type  New Palliative care  Reason for referral  Clarify Goals of Care  Date first seen by Palliative Care  03/04/18  Clinical Assessment  Palliative Performance Scale Score  50%  Psychosocial & Spiritual Assessment  Palliative Care Outcomes  Patient/Family meeting held?  Yes  Who was at the meeting?  patient, Susan Bradford, friend  Palliative Care Outcomes  Clarified goals of care, ACP counseling assistance, Provided psychosocial or spiritual support, Counseled regarding hospice, Provided end of life care assistance, Improved pain interventions, Improved non-pain symptom therapy, Linked to palliative care logitudinal support      Patient Active Problem List   Diagnosis Date Noted  . Palliative care by specialist   . Goals of care, counseling/discussion   . Anxiety state   . Ovarian cancer ([place]) 02/08/2018  . Ovarian cancer, unspecified laterality ([place]) 02/03/2018  . Metastatic cancer to spine ([place]) 02/24/2018  . Spinal cord compression due to malignant neoplasm metastatic to spine ([place]) 02/09/2018  . Back pain 02/21/2018  . COPD (chronic obstructive pulmonary disease) ([place]) 02/29/2016    Palliative Care Assessment & Plan   Patient Profile: 84 y.o. female  with past medical history of hypertension, hyperlipidemia, ovarian cancer s/p radical resection 11/05/16 admitted on 02/11/2018 with back pain. CT revealed T6 tumor with cord compression. Chest xray revealed lung mass, no pneumonia. On 7/24, patient underwent CT-guided biopsy of omentum which confirmed  recurrent metastatic disease in her pelvis. On 7/26, patient underwent epidural tumor resection with pathology confirming metastatic, recurrent granulosa cell tumor. Oncology following and met  with patient/family on 7/30 to discuss poor prognosis. She is currently no strong enough for palliative chemotherapy. PT recommending SNF for rehab. Palliative medicine consultation for goals of care and pain management.   Assessment: Metastatic ovarian cancer Spinal cord compression due to malignant neoplasm Back pain COPD Depression/anxiety  Recommendations/Plan:  MOST form completed with patient and Susan Bradford via telephone. Patient requesting DNR/DNI, comfort focused care, antibiotics and/or IVF for time trial, and NO feeding tube. MOST form and durable DNR copies made for patient/Susan Bradford.  Patient wishes to be "put on palliative care." I do believe she is eligible for hospice services but not yet eligible for residential hospice and would have to pay out-of-pocket bed fee for hospice at SNF. At this point, discharge likely SNF with palliative follow-up.  Patient/Susan Bradford plan to f/u with outpatient radiation oncology for palliative radiation to spine.   Fentanyl 12.57mg TD q72h. Continue scheduled tylenol and prn norco/tramadol.  PMT will follow.   Code Status: DNR   Code Status Orders  (From admission, onward)        Start     Ordered   02/24/18 0220  Full code  Continuous     02/24/18 0221    Code Status History    This patient has a current code status but no historical code status.    Advance Directive Documentation     Most Recent Value  Type of Advance Directive  Healthcare Power of Attorney  Pre-existing out of facility DNR order (yellow form or pink MOST form)  -  "MOST" Form in Place?  -       Prognosis:   Unable to determine: poor prognosis with metastatic ovarian cancer and not a candidate for chemotherapy. High risk for decline.   [place]  Care plan was discussed with patient, Susan Bradford, Dr. ACathlean Sauerupdated   Thank you for allowing the Palliative Medicine Team to assist in the care of this patient.   Time In: 1315 Time Out: 1415 Total Time 681m Prolonged Time Billed no      Greater than 50%  of this time was spent counseling and coordinating care related to the above assessment and plan.  MeIhor DowFNP-C Palliative Medicine Team  Phone: [phone]: [phone] contact Palliative Medicine Team phone at [phone] questions and concerns.

## 2018-03-07 DIAGNOSIS — Z515 Encounter for palliative care: Secondary | ICD-10-CM

## 2018-03-07 LAB — CBC WITH DIFFERENTIAL/PLATELET
ABS IMMATURE GRANULOCYTES: 0.1 10*3/uL (ref 0.0–0.1)
BASOS ABS: 0 10*3/uL (ref 0.0–0.1)
BASOS PCT: 0 %
Eosinophils Absolute: 0 10*3/uL (ref 0.0–0.7)
Eosinophils Relative: 0 %
HCT: 23.5 % — ABNORMAL LOW (ref 36.0–46.0)
Hemoglobin: 7.6 g/dL — ABNORMAL LOW (ref 12.0–15.0)
Immature Granulocytes: 1 %
Lymphocytes Relative: 3 %
Lymphs Abs: 0.4 10*3/uL — ABNORMAL LOW (ref 0.7–4.0)
MCH: 28.4 pg (ref 26.0–34.0)
MCHC: 32.3 g/dL (ref 30.0–36.0)
MCV: 87.7 fL (ref 78.0–100.0)
Monocytes Absolute: 1.5 10*3/uL — ABNORMAL HIGH (ref 0.1–1.0)
Monocytes Relative: 13 %
NEUTROS ABS: 9.1 10*3/uL — AB (ref 1.7–7.7)
NEUTROS PCT: 83 %
PLATELETS: 242 10*3/uL (ref 150–400)
RBC: 2.68 MIL/uL — ABNORMAL LOW (ref 3.87–5.11)
RDW: 13.4 % (ref 11.5–15.5)
WBC: 11.1 10*3/uL — AB (ref 4.0–10.5)

## 2018-03-07 MED ORDER — ACETAMINOPHEN 160 MG/5ML PO SOLN: 650.0000 mg | Freq: Three times a day (TID) | ORAL | Status: DC

## 2018-03-07 MED ORDER — ACETAMINOPHEN 325 MG PO TABS
650.0000 mg | ORAL_TABLET | Freq: Three times a day (TID) | ORAL | Status: DC
  Administered 2018-03-07 – 2018-03-08 (×3): 650 mg via ORAL
  Filled 2018-03-07 (×3): qty 2

## 2018-03-07 NOTE — Progress Notes (Signed)
PROGRESS NOTE    Susan Bradford  JJH:[id number] DOB: [birth year] DOA: 03/02/2018 PCP: Merrilee Seashore, MD    Brief Narrative:  84 year old female who presented with back pain. She does have significant past medical history for ovarian cancer.Reported worsening back pain, severe intensity refractive to outpatient management. Initial physical examination with blood pressure 168/76, heart rate 67, respiratory rate20, temperature is97.6, oxygen saturation 94%, moist mucous membranes, lungs clear to auscultation bilaterally, heart S1-S2 present rhythmic, abdomen soft nontender, no lower extremity edema. CT chest with multiple pulmonary nodules, expansible mass left T6 vertebral body consistent with tumor resulting in severe canal stenosis and cord compression. 4.1 cm aneurysmal ascending aorta. Chest x-ray with left pulmonary nodules. EKG with normal sinus rhythm.  Patient was admitted to the hospital with the working diagnosis of T6 cord compression related to metastatic ovarian cancer.   Assessment & Plan:   Principal Problem:   Spinal cord compression due to malignant neoplasm metastatic to spine [facility]) Active Problems:   Ovarian cancer, unspecified laterality ([facility])   Metastatic cancer to spine ([place])   Back pain   Ovarian cancer ([place])   Palliative care by specialist   Goals of care, counseling/discussion   Anxiety state   Cancer related pain   Malignant neoplasm metastatic to lung ([place])   1. T6 cord compression sp epidural tumor resection 07/26.Adequate controlwith acetaminophen, hydrocodone,andtramadol. Will need outpatient radiation therapy for further pain control. Plan for myelogram on 08/05. Out of bed to the chair as tolerated.   2. Metastatic ovarian cancer. 11/2016.multipleintra-abdominal metastasis. Will need palliative.  3. HTN.Ontriamterene and hctz, with good toleration    4. Hypothyroid.On levothyroxine.   5. COPD no exacerbation. On  dulera.  6. Depression/ anxiety.On sertraline 50 mg daily, plus PRN lorazepam, no agitation or confusion.More calm today.     DVT prophylaxis:enoxaparin Code Status:full Family Communication:no family at the bedside Disposition Plan/ discharge barriers:SNF when bed available.   Consultants:  Oncology   Neurosurgery  Procedures:    Antimicrobials:   Subjective: Patient is not feeling well, positive malaise, no specific worsening pain, dyspnea, nausea or vomiting.   Objective: Vitals:   03/06/18 2129 03/06/18 2322 03/07/18 0302 03/07/18 0755  BP:  (!) 122/50 125/65 (!) 145/69  Pulse:  91 76 63  Resp:  18 18 16   Temp:  99 F (37.2 C) 99.1 F (37.3 C) 98.2 F (36.8 C)  TempSrc:  Oral Oral Oral  SpO2: 94% 95% 96% 96%  Weight:      Height:        Intake/Output Summary (Last 24 hours) at 03/07/2018 1058 Last data filed at 03/07/2018 0830 Gross per 24 hour  Intake 120 ml  Output 700 ml  Net -580 ml   Filed Weights   02/24/18 1113 02/06/2018 1135  Weight: 63.6 kg (140 lb 3.4 oz) 63.6 kg (140 lb 3.4 oz)    Examination:   General: Not in pain or dyspnea, deconditioned  Neurology: Awake and alert, non focal  E ENT: mild pallor, no icterus, oral mucosa moist Cardiovascular: No JVD. S1-S2 present, rhythmic, no gallops, rubs, or murmurs. No lower extremity edema. Pulmonary: positive breath sounds bilaterally, adequate air movement, no wheezing, rhonchi or rales. On anterior and lateral auscultation.  Gastrointestinal. Abdomen with no organomegaly, non tender, no rebound or guarding Skin. No rashes Musculoskeletal: no joint deformities     Data Reviewed: I have personally reviewed following labs and imaging studies  CBC: Recent Labs  Lab 03/03/18 0503 03/04/18 0532 03/05/18 8185  03/06/18 0546 03/07/18 0449  WBC 8.3 7.2 7.7 8.1 11.1*  NEUTROABS 6.5 5.4 5.7 6.0 9.1*  HGB 9.3* 8.2* 7.9* 7.8* 7.6*  HCT 27.8* 25.1* 24.3* 24.1* 23.5*   MCV 89.1 89.6 88.4 88.3 87.7  PLT 189 192 196 236 710   Basic Metabolic Panel: Recent Labs  Lab 03/01/18 0638 03/02/18 0644 03/03/18 0503 03/04/18 0532  NA 131* 133* 131* 130*  K 3.7 3.5 3.0* 3.7  CL 90* 91* 93* 92*  CO2 34* 31 31 31   GLUCOSE 130* 120* 128* 102*  BUN 16 24* 27* 26*  CREATININE 0.88 0.91 0.77 0.82  CALCIUM 10.9* 10.8* 10.2 10.1   GFR: Estimated Creatinine Clearance: 48.4 mL/min (by C-G formula based on SCr of 0.82 mg/dL). Liver Function Tests: No results for input(s): AST, ALT, ALKPHOS, BILITOT, PROT, ALBUMIN in the last 168 hours. No results for input(s): LIPASE, AMYLASE in the last 168 hours. No results for input(s): AMMONIA in the last 168 hours. Coagulation Profile: No results for input(s): INR, PROTIME in the last 168 hours. Cardiac Enzymes: No results for input(s): CKTOTAL, CKMB, CKMBINDEX, TROPONINI in the last 168 hours. BNP (last 3 results) No results for input(s): PROBNP in the last 8760 hours. HbA1C: No results for input(s): HGBA1C in the last 72 hours. CBG: No results for input(s): GLUCAP in the last 168 hours. Lipid Profile: No results for input(s): CHOL, HDL, LDLCALC, TRIG, CHOLHDL, LDLDIRECT in the last 72 hours. Thyroid Function Tests: No results for input(s): TSH, T4TOTAL, FREET4, T3FREE, THYROIDAB in the last 72 hours. Anemia Panel: No results for input(s): VITAMINB12, FOLATE, FERRITIN, TIBC, IRON, RETICCTPCT in the last 72 hours.    Radiology Studies: I have reviewed all of the imaging during this hospital visit personally     Scheduled Meds: . acetaminophen  650 mg Oral Q8H  . atorvastatin  80 mg Oral q1800  . bisacodyl  10 mg Rectal Daily  . feeding supplement (ENSURE ENLIVE)  237 mL Oral BID BM  . fentaNYL  12.5 mcg Transdermal Q72H  . levothyroxine  50 mcg Oral Daily  . mometasone-formoterol  2 puff Inhalation BID  . senna-docusate  2 tablet Oral BID  . sertraline  50 mg Oral Daily  . sodium chloride flush  3 mL  Intravenous Q12H  . triamterene-hydrochlorothiazide  1 tablet Oral Daily   Continuous Infusions: . sodium chloride    . lactated ringers Stopped (02/28/18 0526)     LOS: 11 days        Chrisette Man Gerome Apley, MD [facility] Pager [phone]

## 2018-03-07 NOTE — Progress Notes (Signed)
Patient would like her tylenol in pill form instead of liquid I notified pharmacy.

## 2018-03-08 LAB — CBC WITH DIFFERENTIAL/PLATELET
BASOS ABS: 0 10*3/uL (ref 0.0–0.1)
Band Neutrophils: 2 %
Basophils Relative: 0 %
EOS ABS: 0.6 10*3/uL (ref 0.0–0.7)
Eosinophils Relative: 19 %
HEMATOCRIT: 25.4 % — AB (ref 36.0–46.0)
HEMOGLOBIN: 7.8 g/dL — AB (ref 12.0–15.0)
LYMPHS PCT: 9 %
Lymphs Abs: 0.3 10*3/uL — ABNORMAL LOW (ref 0.7–4.0)
MCH: 28.1 pg (ref 26.0–34.0)
MCHC: 30.7 g/dL (ref 30.0–36.0)
MCV: 91.4 fL (ref 78.0–100.0)
MONO ABS: 0.1 10*3/uL (ref 0.1–1.0)
MONOS PCT: 3 %
Neutro Abs: 2.3 10*3/uL (ref 1.7–7.7)
Neutrophils Relative %: 67 %
Platelets: 239 10*3/uL (ref 150–400)
RBC: 2.78 MIL/uL — AB (ref 3.87–5.11)
RDW: 13.5 % (ref 11.5–15.5)
WBC: 3.3 10*3/uL — AB (ref 4.0–10.5)

## 2018-03-08 MED ORDER — ACETAMINOPHEN 650 MG RE SUPP
650.0000 mg | RECTAL | Status: DC | PRN
  Administered 2018-03-08: 650 mg via RECTAL
  Filled 2018-03-08: qty 1

## 2018-03-08 MED ORDER — DEXTROSE IN LACTATED RINGERS 5 % IV SOLN
INTRAVENOUS | Status: DC
  Administered 2018-03-08: 09:00:00 via INTRAVENOUS

## 2018-03-08 MED ORDER — MORPHINE 100MG IN NS 100ML (1MG/ML) PREMIX INFUSION: 1.0000 mg/h | INTRAVENOUS | Status: DC

## 2018-03-08 MED ORDER — MORPHINE SULFATE (PF) 2 MG/ML IV SOLN
1.0000 mg | INTRAVENOUS | Status: DC | PRN
Start: 2018-03-08 — End: 2018-03-08
  Administered 2018-03-08 (×2): 1 mg via INTRAVENOUS
  Filled 2018-03-08 (×3): qty 1

## 2018-03-12 ENCOUNTER — Institutional Professional Consult (permissible substitution): Payer: Medicare Other | Admitting: Radiation Oncology

## 2018-03-19 ENCOUNTER — Ambulatory Visit: Payer: Medicare Other | Admitting: Radiation Oncology

## 2018-04-05 NOTE — Progress Notes (Signed)
Patient pronounced deceased by 2 RN's; Sanjuana Mae and myself; family present at bedside; MD made aware. Emotional  support provided to the family.

## 2018-04-05 NOTE — Progress Notes (Signed)
Patient has decreased alertness; drop in O2 saturations; oxygen increased and respiratory called to bedside; MD called; orders received continue to assess patient.

## 2018-04-05 NOTE — Death Summary Note (Signed)
Death Summary  Susan Bradford FGH:[id number] DOB: [birth year] DOA: 2018/02/24  PCP: Merrilee Seashore, MD  Admit date: 2018-02-24 Date of Death: 03-09-2018 Time of Death: 16:40 Notification: Merrilee Seashore, MD notified of death of 2018/03/09   History of present illness:  Susan Bradford is a 84 y.o. female with a history of ovarian cancer.  Susan Bradford presented with complaint of severe back pain.  Susan Bradford did not improve after epidural tumor resection.   84 year old female who presented with back pain. She does have significant past medical history for ovarian cancer.Reported worsening back pain, severe intensity refractive to outpatient management. Initial physical examination with blood pressure 168/76, heart rate 67, respiratory rate20, temperature97.6, oxygen saturation 94%, moist mucous membranes, lungs clear to auscultation bilaterally, heart S1-S2 present rhythmic, abdomen soft nontender, no lower extremity edema. CT chest with multiple pulmonary nodules, expansible mass left T6 vertebral body consistent with tumor resulting in severe canal stenosis and cord compression. 4.1 cm aneurysmal ascending aorta. Chest x-ray with left pulmonary nodule and right base atelectasis. EKG with normal sinus rhythm.  Patient was admitted to the hospital with the working diagnosis of T6 cord compression related to metastatic ovarian cancer.  She underwent epidural tumor resection July 26, she received analgesics for pain control, physical therapy determined that SNF was required at discharge. Her by mouth intake was significantly decreased, on the day of Mar 10, 2023 her condition rapidly deteriorated with worsening encephalopathy, hypotension, hypoxemia and fever. She had advanced directives not to pursue further life sustaining interventions due to her poor prognosis related to her metastatic cancer. No further interventions were pursued, patient was placed on comfort measures. She was  allowed to have natural death.  Final Diagnoses:  1.   Septic shock 2. T6 cord compression 3. Metastatic ovarian cancer    The results of significant diagnostics from this hospitalization (including imaging, microbiology, ancillary and laboratory) are listed below for reference.    Significant Diagnostic Studies: Dg Chest 2 View  Result Date: 24-Feb-2018 CLINICAL DATA:  mid-upper back pain that started Feb last year but got worse today. Pt went to Chiropractor last week and had her back re-aligned. Feels he may have been to hard and today she slept in later than her usual so dont know if that is what caused her back to hurt worse. Also c/o nausea. H/o ovarian cancer and HTN. Former smoker. EXAM: CHEST - 2 VIEW COMPARISON:  02/16/2018 FINDINGS: 3.2 cm bilobed nodular lesion projects in the left upper lung, previously 2.3 cm. On the lateral radiograph, there is suggestion these may represent two discrete lesions in anterior and posterior segments. There may be a 9 mm nodule in the left lower lung as well. Right lung clear. Heart size upper limits normal. Aortic Atherosclerosis (ICD10-170.0). No effusion. Degenerative change in the right shoulder. IMPRESSION: 1. Left lung nodules, enlarging, new since 12/19/2015. Consider CT chest with contrast when the patient is stable for further characterization. Electronically Signed   By: Lucrezia Europe M.D.   On: 2018/02/24 18:14   Dg Thoracic Spine 2 View  Result Date: 02/02/2018 CLINICAL DATA:  Intraoperative localization for T6-T10 fusion with pedicle screw fixation, posterolateral arthrodesis with tumor resection EXAM: THORACIC SPINE 2 VIEWS; DG C-ARM 61-120 MIN COMPARISON:  None. FINDINGS: Intraoperative fluoroscopic AP and lateral views of the mid-thoracic spine demonstrates sequelae of T6-T10 fusion with pedicle screw fixation, with posterolateral arthrodesis for presumed tumor resection. No adverse features. Fluoroscopic time equals 2 minutes and 27 seconds.  IMPRESSION: Intraoperative localization for T6-T10 fusion and arthrodesis for tumor resection. Electronically Signed   By: Jeannine Boga M.D.   On: 02/22/2018 16:06   Dg Thoracic Spine W/swimmers  Result Date: 02/14/2018 CLINICAL DATA:  mid-upper back pain that started Feb last year but got worse today. Pt went to Chiropractor last week and had her back re-aligned. Feels he may have been to hard and today she slept in later than her usual so dont know if that is what caused her back to hurt worse. H/o ovarian cancer and HTN. Former smoker. EXAM: THORACIC SPINE - 3 VIEWS COMPARISON:  02/16/2018 and earlier studies FINDINGS: Mild thoracic dextroscoliosis without evident underlying vertebral anomaly. Not all interspaces are well profiled on the lateral projections. No definite fracture. Anterior vertebral endplate spurring at multiple levels in the mid and lower thoracic spine. Aortic Atherosclerosis (ICD10-170.0). 2.2 cm nodular density projects in the left upper lung, not conspicuous on prior study of 12/19/2015. IMPRESSION: 1. Thoracic dextroscoliosis with multilevel endplate spurring, no definite acute abnormality. 2. Left upper lobe lung lesion. Electronically Signed   By: Lucrezia Europe M.D.   On: 02/02/2018 18:10   Ct Head Wo Contrast  Result Date: 03/01/2018 CLINICAL DATA:  Altered level of consciousness. EXAM: CT HEAD WITHOUT CONTRAST TECHNIQUE: Contiguous axial images were obtained from the base of the skull through the vertex without intravenous contrast. COMPARISON:  None. FINDINGS: Brain: Moderate atrophy and white matter disease is present. Age indeterminate lacunar infarcts are present in the thalami bilaterally. No acute infarct, hemorrhage, or mass lesion is present. Ventricles are proportionate to the degree of atrophy. No significant extra-axial fluid collection is present. Vascular: Atherosclerotic calcifications are present in the cavernous internal carotid arteries bilaterally. Skull:  Calvarium is intact. No focal lytic or blastic lesions are present. No significant extracranial soft tissue injury is present. Sinuses/Orbits: A polyp or mucous retention cyst is present in the left maxillary sinus. The paranasal sinuses and mastoid air cells are clear. Globes and orbits are within normal limits. IMPRESSION: 1. Moderate atrophy and white matter disease likely reflects the sequela of chronic microvascular ischemia. 2. No acute intracranial abnormality. 3. Age indeterminate lacunar infarcts of the thalami bilaterally are likely remote. 4. Atherosclerosis. 5. Polyp or mucous retention cyst involving the left maxillary sinus. Electronically Signed   By: San Morelle M.D.   On: 03/01/2018 12:20   Ct Chest W Contrast  Result Date: 02/05/2018 CLINICAL DATA:  Mid and upper back pain since February 2018, worsening today. Soft higher fracture last week. History of ovarian cancer. EXAM: CT CHEST WITH CONTRAST TECHNIQUE: Multidetector CT imaging of the chest was performed during intravenous contrast administration. CONTRAST:  43mL ISOVUE-300 IOPAMIDOL (ISOVUE-300) INJECTION 61% COMPARISON:  Chest radiograph February 23, 2018 FINDINGS: CARDIOVASCULAR: Heart size mildly enlarged. No pericardial effusion. Mild coronary artery calcification. Ascending aorta is 4.1 cm. Mild calcific atherosclerosis. MEDIASTINUM/NODES: 17 mm anterior mediastinal lymph node versus pulmonary nodule. No hilar, axillary or middle mediastinal lymphadenopathy. LUNGS/PLEURA: Tracheobronchial tree is patent, no pneumothorax. Moderate centrilobular emphysema. RIGHT upper lobe calcified granuloma. Measuring to 1.6 x 2.7 cm LEFT upper lobe. UPPER ABDOMEN: Nonacute.  Normal included adrenal glands. MUSCULOSKELETAL: Soft tissue mass LEFT T6 pedicle extending to the transverse process, laminar and spinous process. Severe canal stenosis consistent with epidural extension, narrowed to approximately 3 mm in transverse dimension (series 2,  image 40). Mild-to-moderate T6 pathologic fracture with approximately 30-50% height loss. Severe degenerative change RIGHT glenohumeral joint space. Old sternal fracture. Osteopenia. IMPRESSION: 1.  Multiple pulmonary nodules measuring to 2.7 cm highly concerning for metastatic disease. Given low-density nodules, this could reflect mucinous metastasis. 2. Expansile mass LEFT T6 vertebral body consistent with tumor resulting in severe canal stenosis and cord compression. Mild to moderate T6 pathologic fracture. Recommend MRI of the thoracic spine with contrast and neuro surgical consultation. 3. 17 mm anterior mediastinal lymph node versus metastasis. 4. **An incidental finding of potential clinical significance has been found. 4.1 cm aneurysmal ascending aorta. Recommend annual imaging followup by CTA or MRA. This recommendation follows 2010 ACCF/AHA/AATS/ACR/ASA/SCA/SCAI/SIR/STS/SVM Guidelines for the Diagnosis and Management of Patients with Thoracic Aortic Disease. Circulation. 2010; 121: E703-J009** 5. Acute findings discussed with and reconfirmed by PA.ABIGAIL HARRIS on 02/02/2018 at 9:39 pm. Aortic Atherosclerosis (ICD10-I70.0) and Emphysema (ICD10-J43.9). Electronically Signed   By: Elon Alas M.D.   On: 02/12/2018 21:40   Mr Brain Wo Contrast  Result Date: 03/02/2018 CLINICAL DATA:  Altered level of consciousness.  Metastatic disease EXAM: MRI HEAD WITHOUT CONTRAST TECHNIQUE: Multiplanar, multiecho pulse sequences of the brain and surrounding structures were obtained without intravenous contrast. COMPARISON:  CT head 03/01/2018 FINDINGS: Brain: Mild atrophy. Negative for acute infarct. Chronic white matter changes are present bilaterally likely due to microvascular ischemia. Negative for hemorrhage mass or edema. No evidence of metastatic disease on unenhanced images. Vascular: Normal arterial flow voids Skull and upper cervical spine: Negative Sinuses/Orbits: Retention cyst left maxillary sinus.  Remaining sinuses clear. Normal orbit Other: None IMPRESSION: Atrophy and moderate chronic microvascular ischemia in the white matter. No acute abnormality. Electronically Signed   By: Franchot Gallo M.D.   On: 03/02/2018 11:45   Mr Thoracic Spine W Wo Contrast  Addendum Date: 02/24/2018   ADDENDUM REPORT: 02/24/2018 09:26 ADDENDUM: Critical Value/emergent results were called by telephone at the time of interpretation on 02/24/2018 at 0914 hours to Dr. Zigmund Daniel, who verbally acknowledged these results. Electronically Signed   By: Genevie Ann M.D.   On: 02/24/2018 09:26   Result Date: 02/24/2018 CLINICAL DATA:  84 year old female status post treatment in 2018 of ovarian tumor. Abnormal chest CT yesterday highly suspicious for pulmonary and left T6 vertebral body metastatic disease. EXAM: MRI THORACIC WITHOUT AND WITH CONTRAST TECHNIQUE: Multiplanar and multiecho pulse sequences of the thoracic spine were obtained without and with intravenous contrast. CONTRAST:  2mL MULTIHANCE GADOBENATE DIMEGLUMINE 529 MG/ML IV SOLN COMPARISON:  Chest CT 02/14/2018 FINDINGS: Limited cervical spine imaging:  Negative. Thoracic spine segmentation: Normal as demonstrated on the recent CT. Alignment: Exaggerated upper thoracic kyphosis. Mild degenerative appearing anterolisthesis of T2 on T3. Vertebrae: There is a mildly edematous and enhancing T1 superior endplate compression fracture which was subtle on the recent CT. See series 11, image 7 and series 5, image 7. The remainder of the T1 vertebra appears within normal limits. Destructive and enhancing mass occupying the left T6 vertebra. The infiltrative appearing mass tracks throughout the left T6 vertebra, mildly crossing midline to the right in both the anterior and posterior elements (series 8, image 20), and also infiltrates the left 6th costovertebral junction and posterior rib (series 8, image 19). The tumor encompasses 31 539 x 35 millimeters (AP by transverse by CC) and  enhances fairly homogeneously. There is invasion of the left T6 neural foramen and epidural space with rightward displacement of the spinal cord and mild spinal cord compression (series 7, image 19). Mild if any associated cord edema. Moderate to severe left T6 neural foraminal stenosis. The right neural foramen is spared. T6 vertebral body height is  relatively maintained. No other thoracic vertebral or posterior rib tumor. Cord: Cord compression with perhaps faint abnormal cord edema at the T6 level. Above and below T6 the spinal cord signal and morphology are normal. No abnormal intradural enhancement. However, there does appear to be a 2 cm segment of left posterolateral dural thickening and enhancement tracking caudally from the mass seen on series 11, image 8. No other dural thickening. Normal conus medullaris at T12-L1. Normal visible cauda equina nerve roots. Paraspinal and other soft tissues: Early left lateral extraosseous extension of tumor and paraspinal soft tissue involvement at T6. Other paraspinal soft tissues are negative. Posterior left upper lobe lung nodules redemonstrated on series 7, image 17. No pleural effusion. Negative visible upper abdominal viscera. Disc levels: Age concordant thoracic spine degeneration. No degenerative spinal stenosis. IMPRESSION: 1. Infiltrative and destructive tumor occupying the left T6 vertebra encompassing 31 x 39 x 35 mm. Extension into the epidural space, the left T6 neural foramen, the posterior left 6th rib, and adjacent paraspinal soft tissues. Note a linear 2 cm segment of left posterolateral dural thickening tracking inferiorly to the T7 level. 2. T6 level spinal cord compression with possible mild cord edema. Moderate to severe left T6 neural foraminal stenosis. 3. There is a mild acute to subacute T1 superior endplate compression fracture, but this appears to be a benign rather than pathologic fracture. 4. No other thoracic spine metastatic disease  identified. Electronically Signed: By: Genevie Ann M.D. On: 02/24/2018 08:55   Ct Abdomen Pelvis W Contrast  Result Date: 02/24/2018 CLINICAL DATA:  Thoracic spine metastasis. Concern for GYN malignancy EXAM: CT ABDOMEN AND PELVIS WITH CONTRAST TECHNIQUE: Multidetector CT imaging of the abdomen and pelvis was performed using the standard protocol following bolus administration of intravenous contrast. CONTRAST:  148mL ISOVUE-300 IOPAMIDOL (ISOVUE-300) INJECTION 61% COMPARISON:  None. FINDINGS: Lower chest: Lung bases are clear. Hepatobiliary: No focal hepatic lesion. No biliary duct dilatation. Gallbladder is normal. Common bile duct is normal. Pancreas: Pancreas is normal. No ductal dilatation. No pancreatic inflammation. Spleen: Normal spleen Adrenals/urinary tract: Adrenal glands and kidneys are normal. The ureters and bladder normal. Stomach/Bowel: Stomach, small bowel, appendix, and cecum are normal. Moderate volume stool in the ascending colon. Rectosigmoid colon normal. Vascular/Lymphatic: Abdominal aorta is normal caliber with atherosclerotic calcification. There is no retroperitoneal or periportal lymphadenopathy. No pelvic lymphadenopathy. Reproductive: Large mass emanating from the pelvis measuring 13.8 x 12.0 by 13.3 cm. The mass is bilobed. There is no normal uterine tissue identified. No normal ovarian tissue identified. There multiple surgical clips positioned between the mass and bladder which may relate to prior GYN surgery/hysterectomy. There is masslike extension into the LEFT adnexal region measuring 5.6 x 4.3 cm. Nodularity extends to the the ventral peritoneal wall and musculature with asymmetric thickening to 2.5 cm in the LEFT rectus abdominus muscle. This asymmetric thickening extends the level the umbilicus (image 31/5 Other: No ascites.  No free-fluid Musculoskeletal: No aggressive osseous lesion. IMPRESSION: 1. Large bilobed mass extending from the pelvis consistent with GYN malignancy.  2. Lobular extension into the LEFT adnexal region with further extension into the ventral peritoneal surface involving the LEFT rectus abdominal muscle to the level of the umbilicus. 3. No evidence bowel obstruction or renal obstruction. 4. No ascites. Electronically Signed   By: Suzy Bouchard M.D.   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 Imaging evidence of recurrent tumor in  the pelvis with peritoneal spread and metastases to the thoracic spine and lungs. Biopsy needed to confirm recurrence. EXAM: CT GUIDED CORE BIOPSY OF PERITONEAL MASS ANESTHESIA/SEDATION: 2.0 mg IV Versed; 50 mcg IV Fentanyl Total Moderate Sedation Time:  10 minutes. The patient's level of consciousness and physiologic status were continuously monitored during the procedure by Radiology nursing. PROCEDURE: The procedure risks, benefits, and alternatives were explained to the patient. Questions regarding the procedure were encouraged and answered. The patient understands and consents to the procedure. CT was performed through the pelvis in a supine position. The left lower abdominal wall was prepped with chlorhexidine in a sterile fashion, and a sterile drape was applied covering the operative field. A sterile gown and sterile gloves were used for the procedure. Local anesthesia was provided with 1% Lidocaine. A 17 gauge trocar needle was advanced into tumor within the left lower pelvis. After confirming needle tip position, 4 separate coaxial 18 gauge core biopsy samples were obtained and submitted in formalin. Additional imaging was performed after outer needle removal. COMPLICATIONS: None FINDINGS: Lobular tumor mass extending to abut the left lower abdominal wall just above the hip joint was targeted. Solid tissue was obtained. IMPRESSION: CT-guided core biopsy performed of tumor within the left lower pelvic peritoneal cavity. Electronically  Signed   By: Aletta Edouard M.D.   On: 02/25/2018 15:53   Dg Chest Port 1 View  Result Date: 03/01/2018 CLINICAL DATA:  COPD, dyspnea, ovarian cancer EXAM: PORTABLE CHEST 1 VIEW COMPARISON:  02/03/2018 chest radiograph. FINDINGS: Bilateral posterior spinal fusion hardware overlies the upper thoracic spine. Stable cardiomediastinal silhouette with top-normal heart size. No pneumothorax. No pleural effusion. No pulmonary edema. Stable 3 cm circumscribed ovoid left upper lung mass. Platelike atelectasis at both lung bases, increased on the right and stable on the left. IMPRESSION: 1. Platelike bibasilar lung atelectasis, increased on the right and stable on the left. 2. Stable known left upper lung mass. Electronically Signed   By: Ilona Sorrel M.D.   On: 03/01/2018 08:02   Dg Abd Portable 2v  Result Date: 03/01/2018 CLINICAL DATA:  Abdominal distension. EXAM: PORTABLE ABDOMEN - 2 VIEW COMPARISON:  None. FINDINGS: Moderate fecal loading in the colon. No evidence of bowel obstruction. No free air, portal venous gas, or pneumatosis identified. IMPRESSION: Moderate fecal loading in the colon. Stable left lung mass better seen on chest x-rays. Electronically Signed   By: Dorise Bullion III M.D   On: 03/01/2018 17:43   Dg C-arm 1-60 Min  Result Date: 02/26/2018 CLINICAL DATA:  Intraoperative localization for T6-T10 fusion with pedicle screw fixation, posterolateral arthrodesis with tumor resection EXAM: THORACIC SPINE 2 VIEWS; DG C-ARM 61-120 MIN COMPARISON:  None. FINDINGS: Intraoperative fluoroscopic AP and lateral views of the mid-thoracic spine demonstrates sequelae of T6-T10 fusion with pedicle screw fixation, with posterolateral arthrodesis for presumed tumor resection. No adverse features. Fluoroscopic time equals 2 minutes and 27 seconds. IMPRESSION: Intraoperative localization for T6-T10 fusion and arthrodesis for tumor resection. Electronically Signed   By: Jeannine Boga M.D.   On: 02/19/2018  16:06   Dg C-arm 1-60 Min  Result Date: 03/04/2018 CLINICAL DATA:  Intraoperative localization for T6-T10 fusion with pedicle screw fixation, posterolateral arthrodesis with tumor resection EXAM: THORACIC SPINE 2 VIEWS; DG C-ARM 61-120 MIN COMPARISON:  None. FINDINGS: Intraoperative fluoroscopic AP and lateral views of the mid-thoracic spine demonstrates sequelae of T6-T10 fusion with pedicle screw fixation, with posterolateral arthrodesis for presumed tumor resection. No adverse features. Fluoroscopic time equals 2 minutes  and 27 seconds. IMPRESSION: Intraoperative localization for T6-T10 fusion and arthrodesis for tumor resection. Electronically Signed   By: Jeannine Boga M.D.   On: 02/08/2018 16:06    Microbiology: Recent Results (from the past 240 hour(s))  Surgical pcr screen     Status: None   Collection Time: 02/10/2018 10:25 AM  Result Value Ref Range Status   MRSA, PCR NEGATIVE NEGATIVE Final   Staphylococcus aureus NEGATIVE NEGATIVE Final    Comment: (NOTE) The Xpert SA Assay (FDA approved for NASAL specimens in patients 62 years of age and older), is one component of a comprehensive surveillance program. It is not intended to diagnose infection nor to guide or monitor treatment. Performed at [facility], [place] [address] [place], [place] [address]      Labs: Basic Metabolic Panel: Recent Labs  Lab 03/02/18 0644 03/03/18 0503 03/04/18 0532  NA 133* 131* 130*  K 3.5 3.0* 3.7  CL 91* 93* 92*  CO2 31 31 31   GLUCOSE 120* 128* 102*  BUN 24* 27* 26*  CREATININE 0.91 0.77 0.82  CALCIUM 10.8* 10.2 10.1   Liver Function Tests: No results for input(s): AST, ALT, ALKPHOS, BILITOT, PROT, ALBUMIN in the last 168 hours. No results for input(s): LIPASE, AMYLASE in the last 168 hours. No results for input(s): AMMONIA in the last 168 hours. CBC: Recent Labs  Lab 03/04/18 0532 03/05/18 0520 03/06/18 0546 03/07/18 0449 2018-03-22 1136  WBC 7.2 7.7 8.1 11.1* 3.3*    NEUTROABS 5.4 5.7 6.0 9.1* 2.3  HGB 8.2* 7.9* 7.8* 7.6* 7.8*  HCT 25.1* 24.3* 24.1* 23.5* 25.4*  MCV 89.6 88.4 88.3 87.7 91.4  PLT 192 196 236 242 239   Cardiac Enzymes: No results for input(s): CKTOTAL, CKMB, CKMBINDEX, TROPONINI in the last 168 hours. D-Dimer No results for input(s): DDIMER in the last 72 hours. BNP: Invalid input(s): POCBNP CBG: No results for input(s): GLUCAP in the last 168 hours. Anemia work up No results for input(s): VITAMINB12, FOLATE, FERRITIN, TIBC, IRON, RETICCTPCT in the last 72 hours. Urinalysis    Component Value Date/Time   COLORURINE YELLOW 03/03/2018 0327   APPEARANCEUR HAZY (A) 03/03/2018 0327   LABSPEC 1.018 03/03/2018 0327   PHURINE 5.0 03/03/2018 0327   GLUCOSEU NEGATIVE 03/03/2018 0327   HGBUR MODERATE (A) 03/03/2018 0327   BILIRUBINUR NEGATIVE 03/03/2018 0327   KETONESUR NEGATIVE 03/03/2018 0327   PROTEINUR NEGATIVE 03/03/2018 0327   NITRITE NEGATIVE 03/03/2018 0327   LEUKOCYTESUR NEGATIVE 03/03/2018 0327   Sepsis Labs Invalid input(s): PROCALCITONIN,  WBC,  LACTICIDVEN     SIGNED:  Tawni Millers, MD  [facility] March 22, 2018, 5:02 PM Pager   If 7PM-7AM, please contact night-coverage [email or web address] Password [email or web address]

## 2018-04-05 NOTE — Progress Notes (Signed)
PROGRESS NOTE    TUYEN UNCAPHER  GEZ:[id number] DOB: [birth year] DOA: 02/22/2018 PCP: Merrilee Seashore, MD    Brief Narrative:  84 year old female who presented with back pain. She does have significant past medical history for ovarian cancer.Reported worsening back pain, severe intensity refractive to outpatient management. Initial physical examination with blood pressure 168/76, heart rate 67, respiratory rate20, temperature is97.6, oxygen saturation 94%, moist mucous membranes, lungs clear to auscultation bilaterally, heart S1-S2 present rhythmic, abdomen soft nontender, no lower extremity edema. CT chest with multiple pulmonary nodules, expansible mass left T6 vertebral body consistent with tumor resulting in severe canal stenosis and cord compression. 4.1 cm aneurysmal ascending aorta. Chest x-ray with left pulmonary nodule and right base atelectasis. EKG with normal sinus rhythm.  Patient was admitted to the hospital with the working diagnosis of T6 cord compression related to metastatic ovarian cancer.  Assessment & Plan:   Principal Problem:   Spinal cord compression due to malignant neoplasm metastatic to spine [facility]) Active Problems:   Ovarian cancer, unspecified laterality (HCC)   Metastatic cancer to spine ([place])   Back pain   Ovarian cancer ([place])   Palliative care by specialist   Goals of care, counseling/discussion   Anxiety state   Cancer related pain   Malignant neoplasm metastatic to lung ([place])   1. New metabolic encephalopathy with hypotension and fever. Patient more somnolent today, less reactive and very poor oral intake. Very poor prognosis and risk of sepsis. No infection identified yet. I spoke with her daughter over the phone and decision has made to respect Mrs. Weedon advance directives and hold on any life prolonging intervention. Will concentrate in comfort measures. Use as needed IV morphine and will control fever with acetaminophen. Will continue IV  fluids with dextrose for now.   1. T6 cord compression sp epidural tumor resection 07/26.Now patient on comfort measures due to clinical deterioration, will hold on myelogram and on any potential radiation therapy.   2. Metastatic ovarian cancer. 11/2016.multipleintra-abdominal metastasis. Very poor prognosis, will continue palliative care, and will look into hospice at discharge in the next 24 hours if patient becomes stable.  3. HTN. Today patient hypotensive, will hold on all antihypertensive medications and will continue gentle hydration with lactate ringers.   4. Hypothyroid.On levothyroxine.   5. COPD no exacerbation. dulera.  6. Depression/ anxiety. On sertraline 50 mg daily. Continue as needed lorazepam.    DVT prophylaxis:enoxaparin Code Status:full Family Communication:no family at the bedside Disposition Plan/ discharge barriers:SNF when bed available.   Consultants:  Oncology   Neurosurgery  Procedures:    Antimicrobials:    Subjective: This am patient more lethargic and less reactive, very poor oral intake, no nausea or vomiting. Her oxygenation on room air down to 80's, improved with supplemental 02 per [place].   Objective: Vitals:   03/07/18 2306 2018-03-31 0311 03-31-18 0836 Mar 31, 2018 0938  BP: 94/61 (!) 101/50 (!) 160/61   Pulse: 99 (!) 112 (!) 105   Resp: 18 16 16    Temp: 98.5 F (36.9 C) 99.3 F (37.4 C)    TempSrc: Oral Oral    SpO2: 94% (!) 89% 90% 94%  Weight:      Height:        Intake/Output Summary (Last 24 hours) at 03-31-18 1038 Last data filed at 03/31/18 0312 Gross per 24 hour  Intake -  Output 600 ml  Net -600 ml   Filed Weights   02/24/18 1113 02/22/2018 1135  Weight: 63.6 kg (140 lb  3.4 oz) 63.6 kg (140 lb 3.4 oz)    Examination:   General: deconditioned and ill looking appearing  Neurology: somnolent and poorly reactive E ENT: severe pallor, no icterus, oral mucosa moist Cardiovascular: No JVD.  S1-S2 present, rhythmic, no gallops, rubs, or murmurs. No lower extremity edema. Pulmonary: positive breath sounds bilaterally, adequate air movement, no wheezing, rhonchi or rales. Gastrointestinal. Abdomen flat, no organomegaly, non tender, no rebound or guarding Skin. No rashes Musculoskeletal: no joint deformities     Data Reviewed: I have personally reviewed following labs and imaging studies  CBC: Recent Labs  Lab 03/03/18 0503 03/04/18 0532 03/05/18 0520 03/06/18 0546 03/07/18 0449  WBC 8.3 7.2 7.7 8.1 11.1*  NEUTROABS 6.5 5.4 5.7 6.0 9.1*  HGB 9.3* 8.2* 7.9* 7.8* 7.6*  HCT 27.8* 25.1* 24.3* 24.1* 23.5*  MCV 89.1 89.6 88.4 88.3 87.7  PLT 189 192 196 236 974   Basic Metabolic Panel: Recent Labs  Lab 03/02/18 0644 03/03/18 0503 03/04/18 0532  NA 133* 131* 130*  K 3.5 3.0* 3.7  CL 91* 93* 92*  CO2 31 31 31   GLUCOSE 120* 128* 102*  BUN 24* 27* 26*  CREATININE 0.91 0.77 0.82  CALCIUM 10.8* 10.2 10.1   GFR: Estimated Creatinine Clearance: 48.4 mL/min (by C-G formula based on SCr of 0.82 mg/dL). Liver Function Tests: No results for input(s): AST, ALT, ALKPHOS, BILITOT, PROT, ALBUMIN in the last 168 hours. No results for input(s): LIPASE, AMYLASE in the last 168 hours. No results for input(s): AMMONIA in the last 168 hours. Coagulation Profile: No results for input(s): INR, PROTIME in the last 168 hours. Cardiac Enzymes: No results for input(s): CKTOTAL, CKMB, CKMBINDEX, TROPONINI in the last 168 hours. BNP (last 3 results) No results for input(s): PROBNP in the last 8760 hours. HbA1C: No results for input(s): HGBA1C in the last 72 hours. CBG: No results for input(s): GLUCAP in the last 168 hours. Lipid Profile: No results for input(s): CHOL, HDL, LDLCALC, TRIG, CHOLHDL, LDLDIRECT in the last 72 hours. Thyroid Function Tests: No results for input(s): TSH, T4TOTAL, FREET4, T3FREE, THYROIDAB in the last 72 hours. Anemia Panel: No results for input(s):  VITAMINB12, FOLATE, FERRITIN, TIBC, IRON, RETICCTPCT in the last 72 hours.    Radiology Studies: I have reviewed all of the imaging during this hospital visit personally     Scheduled Meds: . acetaminophen  650 mg Oral Q8H  . atorvastatin  80 mg Oral q1800  . bisacodyl  10 mg Rectal Daily  . feeding supplement (ENSURE ENLIVE)  237 mL Oral BID BM  . fentaNYL  12.5 mcg Transdermal Q72H  . levothyroxine  50 mcg Oral Daily  . mometasone-formoterol  2 puff Inhalation BID  . senna-docusate  2 tablet Oral BID  . sertraline  50 mg Oral Daily  . sodium chloride flush  3 mL Intravenous Q12H  . triamterene-hydrochlorothiazide  1 tablet Oral Daily   Continuous Infusions: . sodium chloride    . dextrose 5% lactated ringers 75 mL/hr at 03-25-18 0929  . lactated ringers Stopped (02/28/18 0526)     LOS: 12 days        Eileen Croswell Gerome Apley, MD [facility] Pager [phone]

## 2018-04-05 NOTE — Progress Notes (Signed)
Friends at bedside, patient has changes in her vital signs, daughter contacted and MD paged.Patient is more difficult to awaken.

## 2018-04-05 NOTE — Progress Notes (Signed)
Daughter has arrived to bedside; comfort measures continued for patient; unable to obtain 02 saturation; patient repositioned for comfort and kept dry.

## 2018-04-05 DEATH — deceased

## 2020-03-20 IMAGING — MR MR THORACIC SPINE WO/W CM
4 of 9 series · 17 of 48 positions shown · IV contrast (multihance)
Comparison: Chest CT 02/23/2018

ADDENDUM:
Critical Value/emergent results were called by telephone at the time
of interpretation on 02/24/2018 at 6911 hours to Dr. Amelou, who
verbally acknowledged these results.
CLINICAL DATA: 83-year-old female status post treatment in 2237 of
ovarian tumor. Abnormal chest CT yesterday highly suspicious for
pulmonary and left T6 vertebral body metastatic disease.

EXAM:
MRI THORACIC WITHOUT AND WITH CONTRAST
TECHNIQUE: Multiplanar and multiecho pulse sequences of the thoracic spine were
obtained without and with intravenous contrast.
CONTRAST:  14mL MULTIHANCE GADOBENATE DIMEGLUMINE 529 MG/ML IV SOLN

[Series 5: T1 · sagittal · 3.0mm · 0.66mm/px · 3 of 16 slices shown (1 of 2)]
[im 1/16]
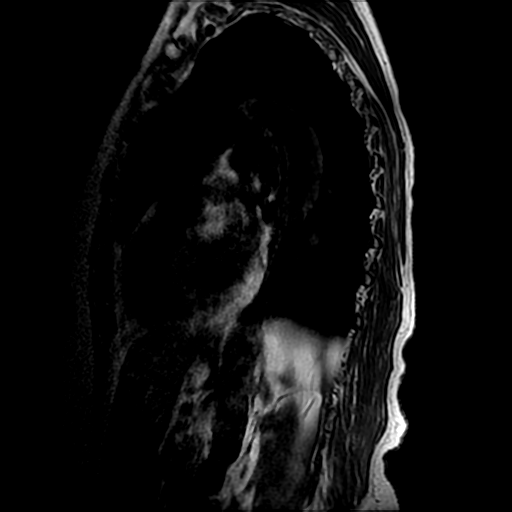
[im 11/16]
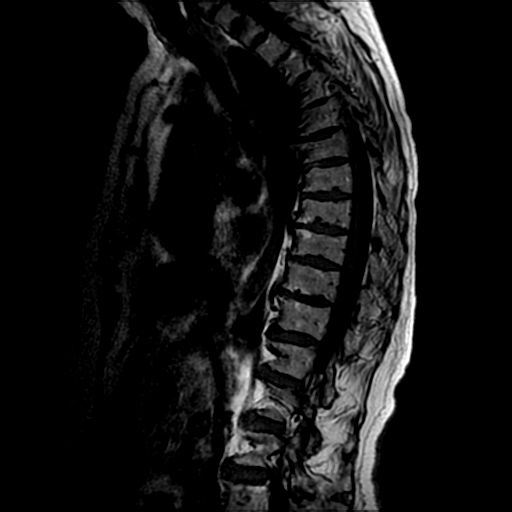
[im 16/16]
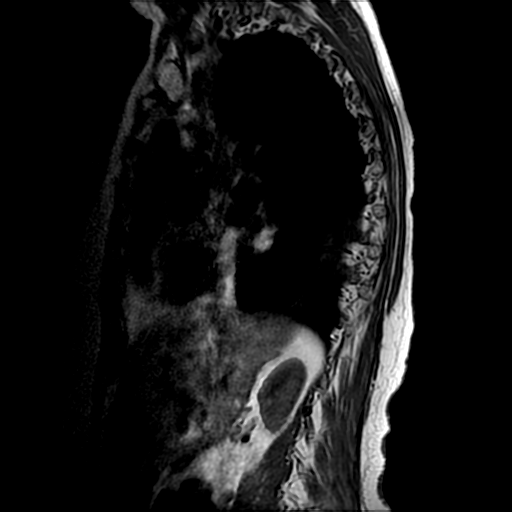

[Series 7: T2 · axial · 4.0mm · 0.39mm/px · z∈[-244,-79]mm · 8 of 44 slices shown]
[im 1/44]
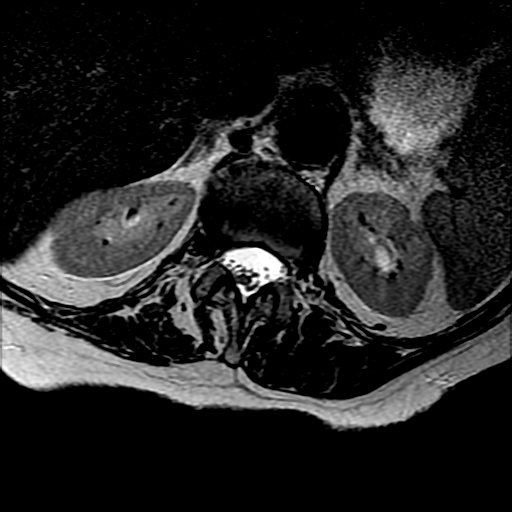
[im 7/44]
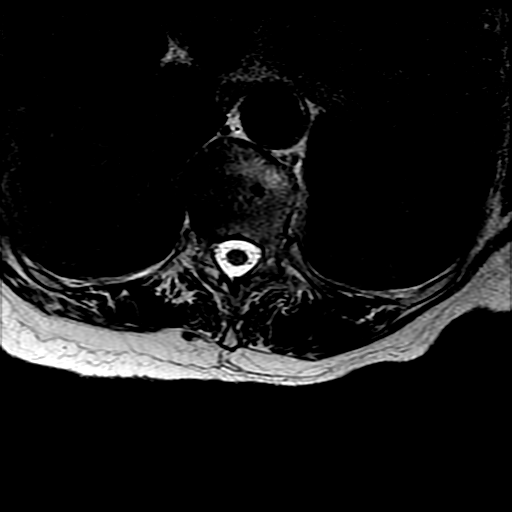
[im 13/44]
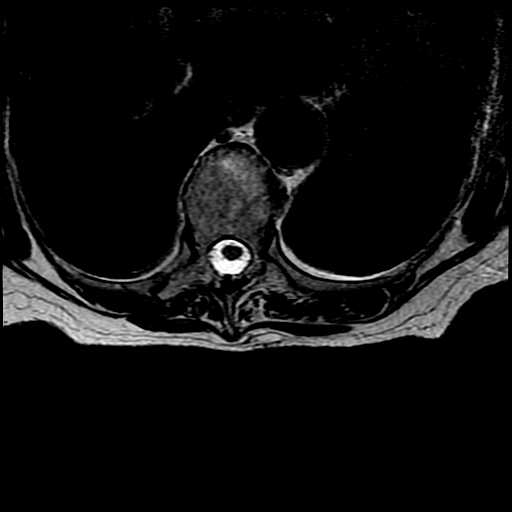
[im 19/44]
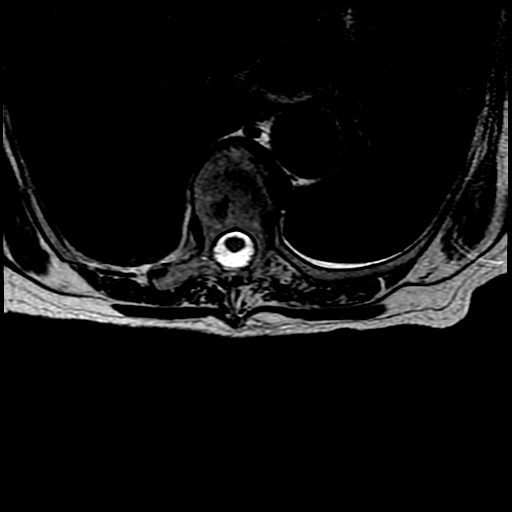
[im 25/44]
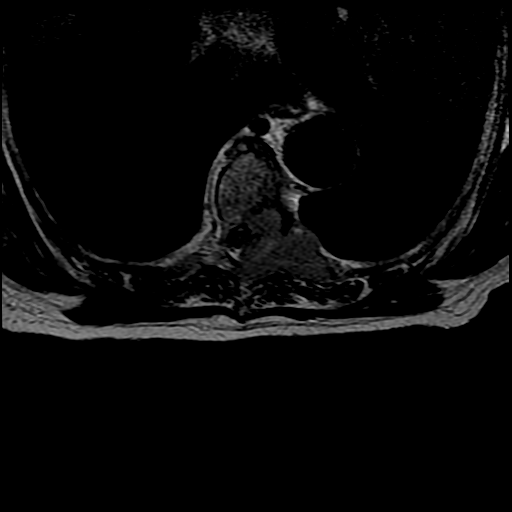
[im 31/44]
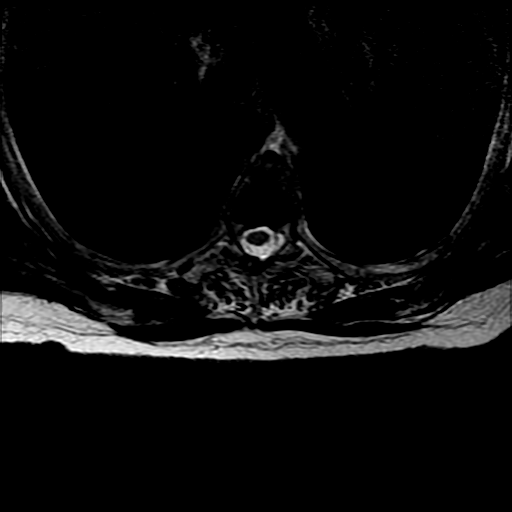
[im 37/44]
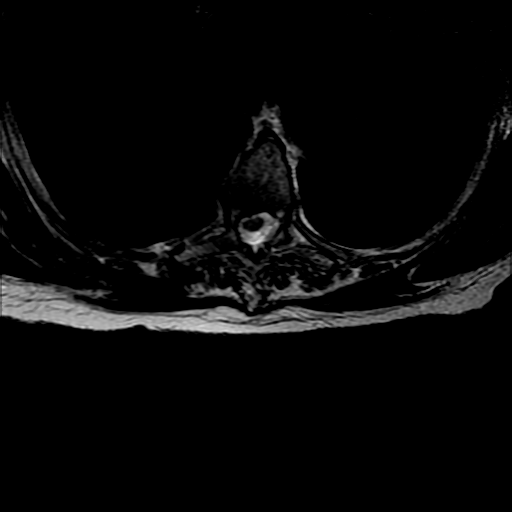
[im 44/44]
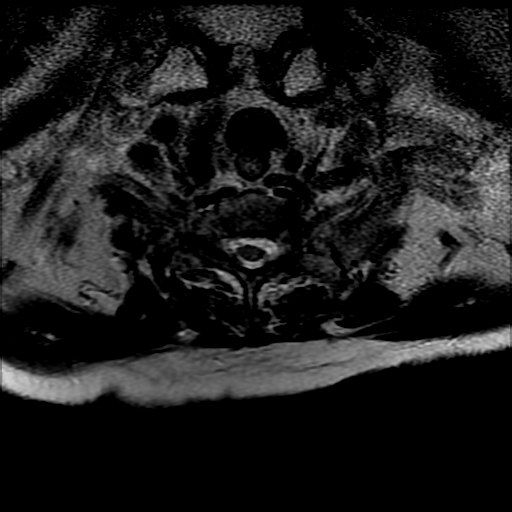

[Series 9: T1 · axial · non-contrast · 4.0mm · 0.39mm/px · z∈[-191,-92]mm · 3 of 44 slices shown (2 of 2)]
[im 7/44]
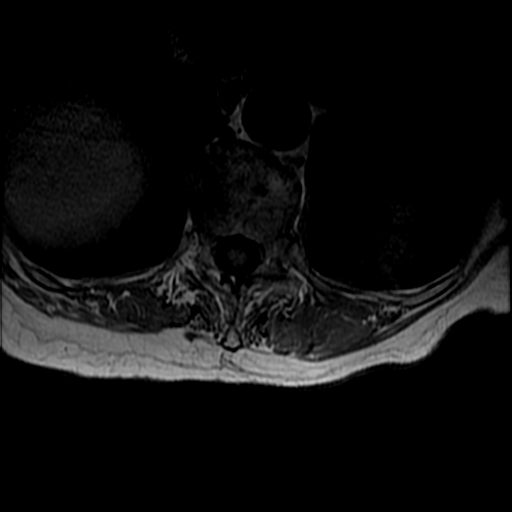
[im 25/44]
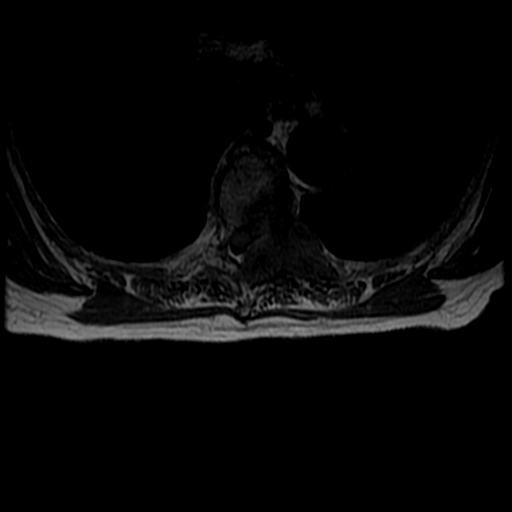
[im 37/44]
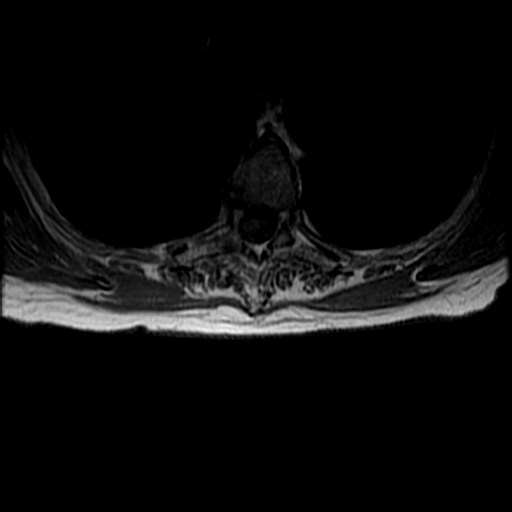

[Series 10: T2 post-contrast · sagittal · 3.0mm · 0.66mm/px · 3 of 16 slices shown]
[im 1/16]
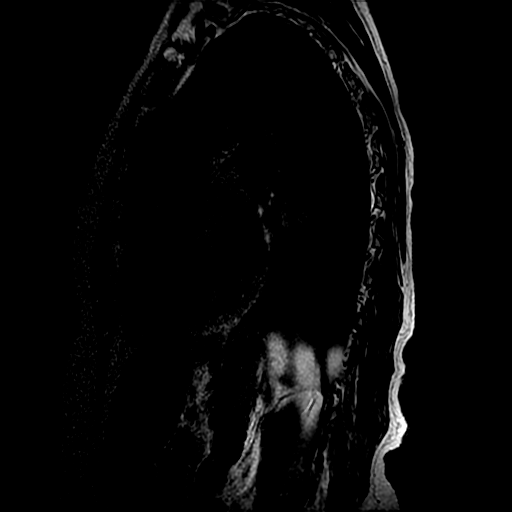
[im 8/16]
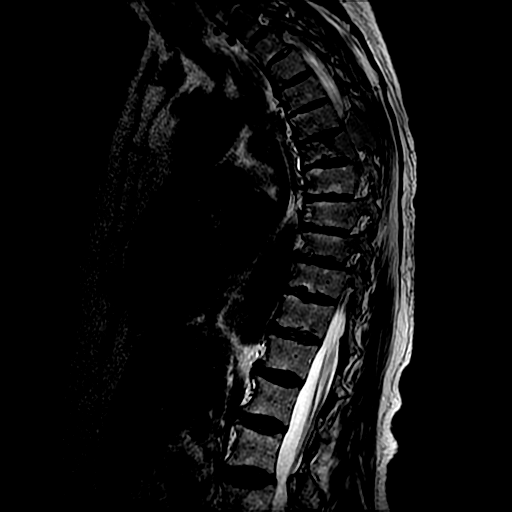
[im 16/16]
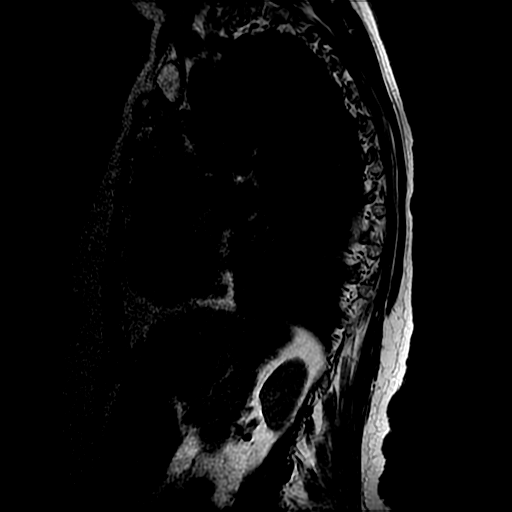

[17 of 48 positions shown; findings below may reference images not displayed]

FINDINGS: Limited cervical spine imaging:  Negative.

Thoracic spine segmentation: Normal as demonstrated on the recent
CT.

Alignment: Exaggerated upper thoracic kyphosis. Mild degenerative
appearing anterolisthesis of T2 on T3.

Vertebrae: There is a mildly edematous and enhancing T1 superior
endplate compression fracture which was subtle on the recent CT. See
series 11, image 7 and series 5, image 7. The remainder of the T1
vertebra appears within normal limits.

Destructive and enhancing mass occupying the left T6 vertebra. The
infiltrative appearing mass tracks throughout the left T6 vertebra,
mildly crossing midline to the right in both the anterior and
posterior elements (series 8, image 20), and also infiltrates the
left 6th costovertebral junction and posterior rib (series 8, image
19). The tumor encompasses 31 539 x 35 millimeters (AP by transverse
by CC) and enhances fairly homogeneously. There is invasion of the
left T6 neural foramen and epidural space with rightward
displacement of the spinal cord and mild spinal cord compression
(series 7, image 19). Mild if any associated cord edema. Moderate to
severe left T6 neural foraminal stenosis. The right neural foramen
is spared. T6 vertebral body height is relatively maintained.

No other thoracic vertebral or posterior rib tumor.

Cord: Cord compression with perhaps faint abnormal cord edema at the
T6 level. Above and below T6 the spinal cord signal and morphology
are normal. No abnormal intradural enhancement. However, there does
appear to be a 2 cm segment of left posterolateral dural thickening
and enhancement tracking caudally from the mass seen on series 11,
image 8.

No other dural thickening.

Normal conus medullaris at T12-L1. Normal visible cauda equina nerve
roots.

Paraspinal and other soft tissues: Early left lateral extraosseous
extension of tumor and paraspinal soft tissue involvement at T6.

Other paraspinal soft tissues are negative.

Posterior left upper lobe lung nodules redemonstrated on series 7,
image 17. No pleural effusion. Negative visible upper abdominal
viscera.

Disc levels:

Age concordant thoracic spine degeneration. No degenerative spinal
stenosis.
IMPRESSION: 1. Infiltrative and destructive tumor occupying the left T6 vertebra
encompassing 31 x 39 x 35 mm.
Extension into the epidural space, the left T6 neural foramen, the
posterior left 6th rib, and adjacent paraspinal soft tissues.
Note a linear 2 cm segment of left posterolateral dural thickening
tracking inferiorly to the T7 level.
2. T6 level spinal cord compression with possible mild cord edema.
Moderate to severe left T6 neural foraminal stenosis.
3. There is a mild acute to subacute T1 superior endplate
compression fracture, but this appears to be a benign rather than
pathologic fracture.
4. No other thoracic spine metastatic disease identified.
